# Patient Record
Sex: Female | Born: 1985 | Race: White | Hispanic: No | Marital: Married | Smoking: Never smoker
Health system: Southern US, Community
[De-identification: ages and names within clinical notes are randomized; demographics above are authoritative.]

## PROBLEM LIST (undated history)

## (undated) DIAGNOSIS — I10 Essential (primary) hypertension: Secondary | ICD-10-CM

## (undated) DIAGNOSIS — O163 Unspecified maternal hypertension, third trimester: Secondary | ICD-10-CM

## (undated) DIAGNOSIS — K219 Gastro-esophageal reflux disease without esophagitis: Secondary | ICD-10-CM

## (undated) DIAGNOSIS — Z9889 Other specified postprocedural states: Secondary | ICD-10-CM

## (undated) DIAGNOSIS — R112 Nausea with vomiting, unspecified: Secondary | ICD-10-CM

## (undated) DIAGNOSIS — K760 Fatty (change of) liver, not elsewhere classified: Secondary | ICD-10-CM

## (undated) DIAGNOSIS — O24419 Gestational diabetes mellitus in pregnancy, unspecified control: Secondary | ICD-10-CM

## (undated) HISTORY — PX: WISDOM TOOTH EXTRACTION: SHX21

## (undated) HISTORY — PX: KIDNEY STONE SURGERY: SHX686

## (undated) HISTORY — DX: Gestational diabetes mellitus in pregnancy, unspecified control: O24.419

---

## 2008-07-27 ENCOUNTER — Other Ambulatory Visit: Payer: Self-pay

## 2008-10-05 ENCOUNTER — Ambulatory Visit: Payer: Self-pay

## 2008-10-07 ENCOUNTER — Inpatient Hospital Stay: Payer: Self-pay

## 2010-04-13 ENCOUNTER — Observation Stay: Payer: Self-pay

## 2010-04-15 ENCOUNTER — Ambulatory Visit: Payer: Self-pay | Admitting: Obstetrics & Gynecology

## 2010-04-16 ENCOUNTER — Inpatient Hospital Stay: Payer: Self-pay | Admitting: Obstetrics & Gynecology

## 2015-05-23 ENCOUNTER — Ambulatory Visit (INDEPENDENT_AMBULATORY_CARE_PROVIDER_SITE_OTHER): Payer: BC Managed Care – PPO | Admitting: Family Medicine

## 2015-05-23 ENCOUNTER — Encounter: Payer: Self-pay | Admitting: Family Medicine

## 2015-05-23 VITALS — BP 108/72 | HR 67 | Temp 97.9°F | Ht 63.0 in | Wt 221.4 lb

## 2015-05-23 DIAGNOSIS — H6982 Other specified disorders of Eustachian tube, left ear: Secondary | ICD-10-CM | POA: Diagnosis not present

## 2015-05-23 DIAGNOSIS — J029 Acute pharyngitis, unspecified: Secondary | ICD-10-CM

## 2015-05-23 DIAGNOSIS — H6503 Acute serous otitis media, bilateral: Secondary | ICD-10-CM

## 2015-05-23 MED ORDER — AMOXICILLIN-POT CLAVULANATE 875-125 MG PO TABS: 1.0000 | ORAL_TABLET | Freq: Two times a day (BID) | ORAL | Status: DC

## 2015-05-23 NOTE — Progress Notes (Signed)
Name: Susan Higgins   MRN: [id number]    DOB: [birth year]   Date:05/23/2015       Progress Note  Subjective  Chief Complaint  Chief Complaint  Patient presents with  . Sore Throat    Patient states that she has an area that is swollen on her neck and hurts to swallow. Patient states that she also has a cough    Sore Throat  This is a new problem. The current episode started 1 to 4 weeks ago. The problem has been waxing and waning. The pain is worse on the left side. There has been no fever. The pain is at a severity of 2/10. The pain is mild. Associated symptoms include congestion, coughing, ear pain, neck pain, shortness of breath and swollen glands. Pertinent negatives include no abdominal pain, diarrhea, drooling, ear discharge, headaches, hoarse voice, plugged ear sensation, stridor, trouble swallowing or vomiting. Associated symptoms comments: Eustachian symptoms. She has tried nothing for the symptoms. The treatment provided no relief.    No problem-specific assessment & plan notes found for this encounter.   History reviewed. No pertinent past medical history.  Past Surgical History  Procedure Laterality Date  . Wisdom tooth extraction    . Cesarean section  4098,1191    x 2    History reviewed. No pertinent family history.  Social History   Social History  . Marital Status: Married    Spouse Name: N/A  . Number of Children: N/A  . Years of Education: N/A   Occupational History  . Not on file.   Social History Main Topics  . Smoking status: Never Smoker   . Smokeless tobacco: Not on file  . Alcohol Use: No  . Drug Use: No  . Sexual Activity: Not on file   Other Topics Concern  . Not on file   Social History Narrative  . No narrative on file    No Known Allergies   Review of Systems  Constitutional: Negative for fever, chills, weight loss and malaise/fatigue.  HENT: Positive for congestion and ear pain. Negative for drooling, ear discharge, hoarse  voice, sore throat and trouble swallowing.   Eyes: Negative for blurred vision.  Respiratory: Positive for cough and shortness of breath. Negative for sputum production, wheezing and stridor.   Cardiovascular: Negative for chest pain, palpitations and leg swelling.  Gastrointestinal: Negative for heartburn, nausea, vomiting, abdominal pain, diarrhea, constipation, blood in stool and melena.  Genitourinary: Negative for dysuria, urgency, frequency and hematuria.  Musculoskeletal: Positive for neck pain. Negative for myalgias, back pain and joint pain.  Skin: Negative for rash.  Neurological: Negative for dizziness, tingling, sensory change, focal weakness and headaches.  Endo/Heme/Allergies: Negative for environmental allergies and polydipsia. Does not bruise/bleed easily.  Psychiatric/Behavioral: Negative for depression and suicidal ideas. The patient is not nervous/anxious and does not have insomnia.      Objective  Filed Vitals:   05/23/15 0822  BP: 108/72  Pulse: 67  Temp: 97.9 F (36.6 C)  TempSrc: Oral  Height:  (1.6 m)  Weight: 221 lb 6.4 oz (100.426 kg)  SpO2: 98%    Physical Exam  Constitutional: She is well-developed, well-nourished, and in no distress. No distress.  HENT:  Head: Normocephalic and atraumatic.  Right Ear: External ear and ear canal normal. A middle ear effusion is present.  Left Ear: External ear and ear canal normal. A middle ear effusion is present.  Nose: Nose normal. No mucosal edema, rhinorrhea or nose lacerations.  Mouth/Throat: Posterior oropharyngeal erythema present. No posterior oropharyngeal edema.  Eyes: Conjunctivae and EOM are normal. Pupils are equal, round, and reactive to light. Right eye exhibits no discharge. Left eye exhibits no discharge.  Neck: Normal range of motion. Neck supple. No JVD present. No thyromegaly present.  Cardiovascular: Normal rate, regular rhythm, normal heart sounds and intact distal pulses.  Exam reveals no  gallop and no friction rub.   No murmur heard. Pulmonary/Chest: Effort normal and breath sounds normal.  Abdominal: Soft. Bowel sounds are normal. She exhibits no mass. There is no tenderness. There is no guarding.  Musculoskeletal: Normal range of motion. She exhibits no edema.  Lymphadenopathy:       Head (right side): No submental, no submandibular and no preauricular adenopathy present.       Head (left side): No submental, no submandibular and no preauricular adenopathy present.    She has no cervical adenopathy.       Right cervical: No superficial cervical, no deep cervical and no posterior cervical adenopathy present.      Left cervical: No superficial cervical, no deep cervical and no posterior cervical adenopathy present.  Neurological: She is alert.  Skin: Skin is warm and dry. She is not diaphoretic.  Psychiatric: Mood and affect normal.  Nursing note and vitals reviewed.     Assessment & Plan  Problem List Items Addressed This Visit    None    Visit Diagnoses    Pharyngitis    -  Primary    Relevant Medications    amoxicillin-clavulanate (AUGMENTIN) 875-125 MG tablet    Eustachian tube dysfunction, left        Bilateral acute serous otitis media, recurrence not specified        Relevant Medications    amoxicillin-clavulanate (AUGMENTIN) 875-125 MG tablet         Dr. Hayden Rasmusseneanna Dejuana Weist [facility]  05/23/2015

## 2015-11-18 DIAGNOSIS — Z3481 Encounter for supervision of other normal pregnancy, first trimester: Secondary | ICD-10-CM | POA: Insufficient documentation

## 2015-12-10 DIAGNOSIS — O34219 Maternal care for unspecified type scar from previous cesarean delivery: Secondary | ICD-10-CM | POA: Insufficient documentation

## 2015-12-10 DIAGNOSIS — Z6839 Body mass index (BMI) 39.0-39.9, adult: Secondary | ICD-10-CM | POA: Insufficient documentation

## 2015-12-10 DIAGNOSIS — Z8759 Personal history of other complications of pregnancy, childbirth and the puerperium: Secondary | ICD-10-CM | POA: Insufficient documentation

## 2015-12-10 LAB — OB RESULTS CONSOLE VARICELLA ZOSTER ANTIBODY, IGG: Varicella: IMMUNE

## 2015-12-10 LAB — OB RESULTS CONSOLE HEPATITIS B SURFACE ANTIGEN: Hepatitis B Surface Ag: NEGATIVE

## 2015-12-10 LAB — OB RESULTS CONSOLE RUBELLA ANTIBODY, IGM: Rubella: IMMUNE

## 2015-12-10 LAB — OB RESULTS CONSOLE RPR: RPR: NONREACTIVE

## 2016-02-12 DIAGNOSIS — N889 Noninflammatory disorder of cervix uteri, unspecified: Secondary | ICD-10-CM | POA: Insufficient documentation

## 2016-03-14 ENCOUNTER — Other Ambulatory Visit: Payer: Self-pay | Admitting: Obstetrics & Gynecology

## 2016-03-14 DIAGNOSIS — Z3689 Encounter for other specified antenatal screening: Secondary | ICD-10-CM

## 2016-04-07 ENCOUNTER — Ambulatory Visit: Payer: Self-pay

## 2016-04-16 ENCOUNTER — Ambulatory Visit: Payer: BC Managed Care – PPO | Admitting: Family Medicine

## 2016-04-24 LAB — OB RESULTS CONSOLE HIV ANTIBODY (ROUTINE TESTING): HIV: NONREACTIVE

## 2016-04-29 ENCOUNTER — Encounter: Payer: BC Managed Care – PPO | Attending: Obstetrics & Gynecology | Admitting: *Deleted

## 2016-04-29 ENCOUNTER — Encounter: Payer: Self-pay | Admitting: *Deleted

## 2016-04-29 VITALS — BP 112/82 | Ht 63.0 in | Wt 227.6 lb

## 2016-04-29 DIAGNOSIS — Z713 Dietary counseling and surveillance: Secondary | ICD-10-CM | POA: Insufficient documentation

## 2016-04-29 DIAGNOSIS — O24410 Gestational diabetes mellitus in pregnancy, diet controlled: Secondary | ICD-10-CM

## 2016-04-29 DIAGNOSIS — O24419 Gestational diabetes mellitus in pregnancy, unspecified control: Secondary | ICD-10-CM | POA: Insufficient documentation

## 2016-04-29 NOTE — Patient Instructions (Signed)
Read booklet on Gestational Diabetes Follow Gestational Meal Planning Guidelines Avoid sugar sweetened drinks (tea, juice) Limit fried foods and foods high in fat Complete a 3 Day Food Record and bring to next appointment Check blood sugars 4 x day - before breakfast and 2 hrs after every meal and record  Bring blood sugar log to all appointments Call MD for prescription for meter strips and lancets Strips   One Touch Verio  Lancets   One Touch Delica Purchase urine ketone strips if blood sugars not controlled and check urine ketones every am:  If + increase bedtime snack to 1 protein and 2 carbohydrate servings Walk 20-30 minutes at least 5 x week if permitted by MD

## 2016-04-29 NOTE — Progress Notes (Signed)
Diabetes Self-Management Education  Visit Type: First/Initial  Appt. Start Time: 1510 Appt. End Time: 1650  04/29/2016  [name], identified by name and date of birth, is a 31 y.o. female with a diagnosis of Diabetes: Gestational Diabetes.   ASSESSMENT  Blood pressure 112/82, height 5\' 3"  (1.6 m), weight 227 lb 9.6 oz (103.2 kg), last menstrual period 10/06/2015. Body mass index is 40.32 kg/m.      Diabetes Self-Management Education - 04/29/16 1811      Visit Information   Visit Type First/Initial     Initial Visit   Diabetes Type Gestational Diabetes   Are you currently following a meal plan? No   Are you taking your medications as prescribed? Yes   Date Diagnosed 4 days     Health Coping   How would you rate your overall health? Good     Psychosocial Assessment   Patient Belief/Attitude about Diabetes Other (comment)  "sad and worried about baby"   Self-care barriers None   Self-management support Doctor's office   Patient Concerns Nutrition/Meal planning;Other (comment)  "healthy baby"   Special Needs None   Preferred Learning Style Auditory;Visual   Learning Readiness Ready   How often do you need to have someone help you when you read instructions, pamphlets, or other written materials from your doctor or pharmacy? 1 - Never   What is the last grade level you completed in school? masters     Pre-Education Assessment   Patient understands the diabetes disease and treatment process. Needs Instruction   Patient understands incorporating nutritional management into lifestyle. Needs Instruction   Patient undertands incorporating physical activity into lifestyle. Needs Instruction   Patient understands using medications safely. Needs Instruction   Patient understands monitoring blood glucose, interpreting and using results Needs Instruction   Patient understands prevention, detection, and treatment of acute complications. Needs Instruction   Patient  understands prevention, detection, and treatment of chronic complications. Needs Instruction   Patient understands how to develop strategies to address psychosocial issues. Needs Instruction   Patient understands how to develop strategies to promote health/change behavior. Needs Instruction     Complications   How often do you check your blood sugar? 0 times/day (not testing)  Provided One Touch Verio Flex and instructed on use. BG upon return demonstration was 77 mg/dL at 8:754:40 pm - 3 1/2 hrs pp.    Have you had a dental exam in the past 12 months? Yes   Are you checking your feet? No     Dietary Intake   Breakfast 3 meals, 0-1 snacks   Lunch doesn't eat many vegetables   Beverage(s) water, juice, sugar sweetened tea     Exercise   Exercise Type ADL's     Patient Education   Previous Diabetes Education No   Disease state  Definition of diabetes, type 1 and 2, and the diagnosis of diabetes   Nutrition management  Role of diet in the treatment of diabetes and the relationship between the three main macronutrients and blood glucose level   Physical activity and exercise  Role of exercise on diabetes management, blood pressure control and cardiac health.   Monitoring Taught/evaluated SMBG meter.;Purpose and frequency of SMBG.;Taught/discussed recording of test results and interpretation of SMBG.;Ketone testing, when, how.   Chronic complications Relationship between chronic complications and blood glucose control   Psychosocial adjustment Identified and addressed patients feelings and concerns about diabetes;Role of stress on diabetes   Preconception care Pregnancy and GDM  Role  of pre-pregnancy blood glucose control on the development of the fetus;Reviewed with patient blood glucose goals with pregnancy;Role of family planning for patients with diabetes     Individualized Goals (developed by patient)   Reducing Risk Healthy baby     Outcomes   Expected Outcomes Demonstrated interest in  learning. Expect positive outcomes      Individualized Plan for Diabetes Self-Management Training:   Learning Objective:  Patient will have a greater understanding of diabetes self-management. Patient education plan is to attend individual and/or group sessions per assessed needs and concerns.   Plan:   Patient Instructions  Read booklet on Gestational Diabetes Follow Gestational Meal Planning Guidelines Avoid sugar sweetened drinks (tea, juice) Limit fried foods and foods high in fat Complete a 3 Day Food Record and bring to next appointment Check blood sugars 4 x day - before breakfast and 2 hrs after every meal and record  Bring blood sugar log to all appointments Call MD for prescription for meter strips and lancets Strips   One Touch Verio  Lancets   One Touch Delica Purchase urine ketone strips if blood sugars not controlled and check urine ketones every am:  If + increase bedtime snack to 1 protein and 2 carbohydrate servings Walk 20-30 minutes at least 5 x week if permitted by MD   Expected Outcomes:  Demonstrated interest in learning. Expect positive outcomes  Education material provided:  Gestational Booklet Gestational Meal Planning Guidelines Viewed Gestational Diabetes Video Meter = One Touch Verio Flex 3 Day Food Record Goals for a Healthy Pregnancy  If problems or questions, patient to contact team via:  Sharion Settler, RN, CCM, CDE [phone]  Future DSME appointment:  Tuesday May 06, 2016 with dietitian

## 2016-05-06 ENCOUNTER — Encounter: Payer: BC Managed Care – PPO | Admitting: Dietician

## 2016-05-06 ENCOUNTER — Encounter: Payer: Self-pay | Admitting: Dietician

## 2016-05-06 VITALS — BP 120/84 | Ht 63.0 in | Wt 224.3 lb

## 2016-05-06 DIAGNOSIS — Z713 Dietary counseling and surveillance: Secondary | ICD-10-CM | POA: Diagnosis not present

## 2016-05-06 DIAGNOSIS — S0291XA Unspecified fracture of skull, initial encounter for closed fracture: Secondary | ICD-10-CM | POA: Insufficient documentation

## 2016-05-06 DIAGNOSIS — O24410 Gestational diabetes mellitus in pregnancy, diet controlled: Secondary | ICD-10-CM

## 2016-05-06 NOTE — Progress Notes (Signed)
   Patient's BG record indicates BGs are all within goal ranges.   Patient's food diary indicates controlled carb intake and inclusion of protein sources. She does not eat any vegetables, has been trying to manage a few bites of broccoli or spinach, loves fruits. She has not been eating an evening snack due to lack of hunger. Has lost 3-5lbs in the past week due to diet changes-- significant decrease in sugary beverages and sweets as well as food portions.  Provided 1800kcal meal plan, and wrote individualized menus based on patient's food preferences. Advised frequent intake of fruits in small portions with protein sources. Instructed patient to begin eating a bedtime snack if fasting BGs increase. Advised her to increase protein portions if weight loss continues.   Instructed patient on food safety, including avoidance of Listeriosis, and limiting mercury from fish (does not eat fish).  Discussed importance of maintaining healthy lifestyle habits to reduce risk of Type 2 DM as well as Gestational DM with any future pregnancies.  Advised patient to use any remaining testing supplies to test some BGs after delivery, and to have BG tested ideally annually, as well as prior to attempting future pregnancies.

## 2016-05-06 NOTE — Patient Instructions (Signed)
   Continue with current eating pattern  If weight loss continues, increase portions of protein foods and/or add small amounts of healthy fat, such as salad dressing, soft margarine, or mayo.   If fasting blood sugars increase, experiment with adding a small evening snack that contains some carb and protein, especially on days you are walking for exercise.

## 2016-06-09 LAB — OB RESULTS CONSOLE GC/CHLAMYDIA
Chlamydia: NEGATIVE
GC PROBE AMP, GENITAL: NEGATIVE

## 2016-06-09 LAB — OB RESULTS CONSOLE GBS: STREP GROUP B AG: NEGATIVE

## 2016-06-19 ENCOUNTER — Encounter
Admission: RE | Admit: 2016-06-19 | Discharge: 2016-06-19 | Disposition: A | Discharge status: Home / Self Care | Payer: BC Managed Care – PPO | Source: Ambulatory Visit | Attending: Obstetrics & Gynecology | Admitting: Obstetrics & Gynecology

## 2016-06-19 HISTORY — DX: Other specified postprocedural states: Z98.890

## 2016-06-19 HISTORY — DX: Essential (primary) hypertension: I10

## 2016-06-19 HISTORY — DX: Nausea with vomiting, unspecified: R11.2

## 2016-06-19 LAB — CBC
HEMATOCRIT: 36 % (ref 35.0–47.0)
HEMOGLOBIN: 12.5 g/dL (ref 12.0–16.0)
MCH: 30.7 pg (ref 26.0–34.0)
MCHC: 34.7 g/dL (ref 32.0–36.0)
MCV: 88.3 fL (ref 80.0–100.0)
Platelets: 194 10*3/uL (ref 150–440)
RBC: 4.07 MIL/uL (ref 3.80–5.20)
RDW: 13 % (ref 11.5–14.5)
WBC: 9.9 10*3/uL (ref 3.6–11.0)

## 2016-06-19 LAB — COMPREHENSIVE METABOLIC PANEL
ALBUMIN: 2.8 g/dL — AB (ref 3.5–5.0)
ALK PHOS: 111 U/L (ref 38–126)
ALT: 16 U/L (ref 14–54)
AST: 26 U/L (ref 15–41)
Anion gap: 6 (ref 5–15)
BILIRUBIN TOTAL: 0.5 mg/dL (ref 0.3–1.2)
BUN: 9 mg/dL (ref 6–20)
CALCIUM: 9.1 mg/dL (ref 8.9–10.3)
CO2: 25 mmol/L (ref 22–32)
CREATININE: 1.03 mg/dL — AB (ref 0.44–1.00)
Chloride: 107 mmol/L (ref 101–111)
GFR calc non Af Amer: >60 mL/min (ref >60)
GLUCOSE: 111 mg/dL — AB (ref 65–99)
Potassium: 3 mmol/L — ABNORMAL LOW (ref 3.5–5.1)
SODIUM: 138 mmol/L (ref 135–145)
Total Protein: 6.3 g/dL — ABNORMAL LOW (ref 6.5–8.1)

## 2016-06-19 LAB — TYPE AND SCREEN
ABO/RH(D): A NEG
Antibody Screen: NEGATIVE
Extend sample reason: UNDETERMINED

## 2016-06-19 NOTE — Patient Instructions (Signed)
  Your procedure is scheduled on: June 20, 2016 (Friday) Report to EMERGENCY DEPARTMENT ARRIVAL TIME 5:30 AM  Remember: Instructions that are not followed completely may result in serious medical risk, up to and including death, or upon the discretion of your surgeon and anesthesiologist your surgery may need to be rescheduled.    _x___ 1. Do not eat food or drink liquids after midnight. No gum chewing or  hard candies                              __x__ 2. No Alcohol for 24 hours before or after surgery.   __x__3. No Smoking for 24 prior to surgery.   ____  4. Bring all medications with you on the day of surgery if instructed.    __x__ 5. Notify your doctor if there is any change in your medical condition     (cold, fever, infections).     Do not wear jewelry, make-up, hairpins, clips or nail polish.  Do not wear lotions, powders, or perfumes.   Do not shave 48 hours prior to surgery. Men may shave face and neck.  Do not bring valuables to the hospital.    [facility] is not responsible for any belongings or valuables.               Contacts, dentures or bridgework may not be worn into surgery.  Leave your suitcase in the car. After surgery it may be brought to your room.  For patients admitted to the hospital, discharge time is determined by your treatment team                        Patients discharged the day of surgery will not be allowed to drive home.  You will need someone to drive you home and stay with you the night of your procedure.    Please read over the following fact sheets that you were given:   [facility] Preparing for Surgery and or MRSA Information   _x___ Take anti-hypertensive (unless it includes a diuretic), cardiac, seizure, asthma,     anti-reflux and psychiatric medicines. These include:  1. APPLY SCOPOLAMINE PATCH AS INSTRUCTED BY DR WARD ON APRIL  12 AT BEDTIME  2.  3.  4.  5.  6.  ____Fleets enema or Magnesium Citrate as directed.   _x___ Use  CHG Soap or sage wipes as directed on instruction sheet   ____ Use inhalers on the day of surgery and bring to hospital day of surgery  ____ Stop Metformin and Janumet 2 days prior to surgery.    ____ Take 1/2 of usual insulin dose the night before surgery and none on the morning     surgery.   _x___ Follow recommendations from Cardiologist, Pulmonologist or PCP regarding          stopping Aspirin, Coumadin, Pllavix ,Eliquis, Effient, or Pradaxa, and Pletal.  X____Stop Anti-inflammatories such as Advil, Aleve, Ibuprofen, Motrin, Naproxen, Naprosyn, Goodies powders or aspirin products. OK to take Tylenol   _x___ Stop supplements until after surgery.  But may continue Vitamin D, Vitamin B,       and multivitamin.   ____ Bring C-Pap to the hospital.

## 2016-06-20 ENCOUNTER — Inpatient Hospital Stay: Payer: BC Managed Care – PPO | Admitting: Anesthesiology

## 2016-06-20 ENCOUNTER — Encounter
Admission: RE | Disposition: A | Discharge status: Home / Self Care | Payer: Self-pay | Source: Ambulatory Visit | Attending: Obstetrics & Gynecology

## 2016-06-20 ENCOUNTER — Inpatient Hospital Stay
Admission: RE | Admit: 2016-06-20 | Discharge: 2016-06-22 | DRG: 765 | Disposition: A | Discharge status: Home / Self Care | Payer: BC Managed Care – PPO | Source: Ambulatory Visit | Attending: Obstetrics & Gynecology | Admitting: Obstetrics & Gynecology

## 2016-06-20 DIAGNOSIS — Z302 Encounter for sterilization: Secondary | ICD-10-CM | POA: Diagnosis not present

## 2016-06-20 DIAGNOSIS — J40 Bronchitis, not specified as acute or chronic: Secondary | ICD-10-CM | POA: Diagnosis present

## 2016-06-20 DIAGNOSIS — O26893 Other specified pregnancy related conditions, third trimester: Secondary | ICD-10-CM | POA: Diagnosis present

## 2016-06-20 DIAGNOSIS — O1494 Unspecified pre-eclampsia, complicating childbirth: Principal | ICD-10-CM | POA: Diagnosis present

## 2016-06-20 DIAGNOSIS — O24410 Gestational diabetes mellitus in pregnancy, diet controlled: Secondary | ICD-10-CM | POA: Diagnosis not present

## 2016-06-20 DIAGNOSIS — Z6791 Unspecified blood type, Rh negative: Secondary | ICD-10-CM

## 2016-06-20 DIAGNOSIS — Z20828 Contact with and (suspected) exposure to other viral communicable diseases: Secondary | ICD-10-CM | POA: Diagnosis present

## 2016-06-20 DIAGNOSIS — O3483 Maternal care for other abnormalities of pelvic organs, third trimester: Secondary | ICD-10-CM | POA: Diagnosis present

## 2016-06-20 DIAGNOSIS — Z8249 Family history of ischemic heart disease and other diseases of the circulatory system: Secondary | ICD-10-CM | POA: Diagnosis not present

## 2016-06-20 DIAGNOSIS — O26899 Other specified pregnancy related conditions, unspecified trimester: Secondary | ICD-10-CM

## 2016-06-20 DIAGNOSIS — O24420 Gestational diabetes mellitus in childbirth, diet controlled: Secondary | ICD-10-CM | POA: Diagnosis present

## 2016-06-20 DIAGNOSIS — O99214 Obesity complicating childbirth: Secondary | ICD-10-CM | POA: Diagnosis present

## 2016-06-20 DIAGNOSIS — O9952 Diseases of the respiratory system complicating childbirth: Secondary | ICD-10-CM | POA: Diagnosis present

## 2016-06-20 DIAGNOSIS — Z6841 Body Mass Index (BMI) 40.0 and over, adult: Secondary | ICD-10-CM | POA: Diagnosis not present

## 2016-06-20 DIAGNOSIS — Z6839 Body mass index (BMI) 39.0-39.9, adult: Secondary | ICD-10-CM

## 2016-06-20 DIAGNOSIS — O9962 Diseases of the digestive system complicating childbirth: Secondary | ICD-10-CM | POA: Diagnosis present

## 2016-06-20 DIAGNOSIS — O34219 Maternal care for unspecified type scar from previous cesarean delivery: Secondary | ICD-10-CM | POA: Diagnosis present

## 2016-06-20 DIAGNOSIS — O34211 Maternal care for low transverse scar from previous cesarean delivery: Secondary | ICD-10-CM | POA: Diagnosis present

## 2016-06-20 DIAGNOSIS — K219 Gastro-esophageal reflux disease without esophagitis: Secondary | ICD-10-CM | POA: Diagnosis present

## 2016-06-20 DIAGNOSIS — Z3A37 37 weeks gestation of pregnancy: Secondary | ICD-10-CM

## 2016-06-20 DIAGNOSIS — Q505 Embryonic cyst of broad ligament: Secondary | ICD-10-CM

## 2016-06-20 DIAGNOSIS — D62 Acute posthemorrhagic anemia: Secondary | ICD-10-CM | POA: Diagnosis not present

## 2016-06-20 DIAGNOSIS — O9081 Anemia of the puerperium: Secondary | ICD-10-CM | POA: Diagnosis not present

## 2016-06-20 DIAGNOSIS — O1490 Unspecified pre-eclampsia, unspecified trimester: Secondary | ICD-10-CM | POA: Diagnosis present

## 2016-06-20 LAB — RPR: RPR Ser Ql: NONREACTIVE

## 2016-06-20 LAB — GLUCOSE, CAPILLARY
GLUCOSE-CAPILLARY: 77 mg/dL (ref 65–99)
GLUCOSE-CAPILLARY: 87 mg/dL (ref 65–99)

## 2016-06-20 LAB — ABO/RH: ABO/RH(D): A NEG

## 2016-06-20 SURGERY — Surgical Case
Anesthesia: Spinal | Site: Abdomen | Laterality: Bilateral | Wound class: Clean Contaminated

## 2016-06-20 MED ORDER — SODIUM CHLORIDE 0.9 % IJ SOLN
INTRAMUSCULAR | Status: AC
  Filled 2016-06-20: qty 50

## 2016-06-20 MED ORDER — ONDANSETRON HCL 4 MG/2ML IJ SOLN: 4.0000 mg | Freq: Three times a day (TID) | INTRAMUSCULAR | Status: DC | PRN

## 2016-06-20 MED ORDER — GUAIFENESIN-CODEINE 100-10 MG/5ML PO SOLN
5.0000 mL | ORAL | Status: DC | PRN
  Filled 2016-06-20: qty 5

## 2016-06-20 MED ORDER — NALBUPHINE HCL 10 MG/ML IJ SOLN
5.0000 mg | INTRAMUSCULAR | Status: DC | PRN
  Filled 2016-06-20: qty 1

## 2016-06-20 MED ORDER — BUPIVACAINE HCL 0.5 % IJ SOLN
INTRAMUSCULAR | Status: DC | PRN
  Administered 2016-06-20: 30 mL

## 2016-06-20 MED ORDER — DIPHENHYDRAMINE HCL 25 MG PO CAPS: 25.0000 mg | ORAL_CAPSULE | Freq: Four times a day (QID) | ORAL | Status: DC | PRN

## 2016-06-20 MED ORDER — ALBUTEROL SULFATE (2.5 MG/3ML) 0.083% IN NEBU: 2.5000 mg | INHALATION_SOLUTION | Freq: Four times a day (QID) | RESPIRATORY_TRACT | Status: DC | PRN

## 2016-06-20 MED ORDER — SODIUM CHLORIDE 0.9 % IV SOLN
INTRAVENOUS | Status: DC | PRN
  Administered 2016-06-20: 60 mL

## 2016-06-20 MED ORDER — MORPHINE SULFATE (PF) 0.5 MG/ML IJ SOLN
INTRAMUSCULAR | Status: DC | PRN
  Administered 2016-06-20: .2 mg via EPIDURAL

## 2016-06-20 MED ORDER — PHENYLEPHRINE HCL 10 MG/ML IJ SOLN
INTRAMUSCULAR | Status: DC | PRN
Start: 2016-06-20 — End: 2016-06-20
  Administered 2016-06-20: 100 ug via INTRAVENOUS

## 2016-06-20 MED ORDER — OXYTOCIN 40 UNITS IN LACTATED RINGERS INFUSION - SIMPLE MED
INTRAVENOUS | Status: DC | PRN
  Administered 2016-06-20: 400 mL via INTRAVENOUS
  Administered 2016-06-20: 1000 mL via INTRAVENOUS

## 2016-06-20 MED ORDER — AZITHROMYCIN 250 MG PO TABS
250.0000 mg | ORAL_TABLET | Freq: Every day | ORAL | Status: DC
  Administered 2016-06-20 – 2016-06-22 (×3): 250 mg via ORAL
  Filled 2016-06-20 (×3): qty 1

## 2016-06-20 MED ORDER — OXYCODONE HCL 5 MG PO TABS: 10.0000 mg | ORAL_TABLET | ORAL | Status: DC | PRN

## 2016-06-20 MED ORDER — ACETAMINOPHEN 325 MG PO TABS: 650.0000 mg | ORAL_TABLET | ORAL | Status: DC | PRN

## 2016-06-20 MED ORDER — EPHEDRINE SULFATE-NACL 50-0.9 MG/10ML-% IV SOSY
PREFILLED_SYRINGE | INTRAVENOUS | Status: DC | PRN
  Administered 2016-06-20 (×2): 10 mg via INTRAVENOUS
  Administered 2016-06-20: 15 mg via INTRAVENOUS

## 2016-06-20 MED ORDER — WITCH HAZEL-GLYCERIN EX PADS: 1.0000 "application " | MEDICATED_PAD | CUTANEOUS | Status: DC | PRN

## 2016-06-20 MED ORDER — MEPERIDINE HCL 25 MG/ML IJ SOLN: 6.2500 mg | INTRAMUSCULAR | Status: DC | PRN

## 2016-06-20 MED ORDER — EPHEDRINE SULFATE 50 MG/ML IJ SOLN
INTRAMUSCULAR | Status: AC
  Filled 2016-06-20: qty 1

## 2016-06-20 MED ORDER — FENTANYL CITRATE (PF) 100 MCG/2ML IJ SOLN: 25.0000 ug | INTRAMUSCULAR | Status: DC | PRN

## 2016-06-20 MED ORDER — ONDANSETRON HCL 4 MG/2ML IJ SOLN
INTRAMUSCULAR | Status: DC | PRN
  Administered 2016-06-20: 4 mg via INTRAVENOUS

## 2016-06-20 MED ORDER — LACTATED RINGERS IV SOLN
INTRAVENOUS | Status: DC
  Administered 2016-06-20: 125 mL/h via INTRAVENOUS

## 2016-06-20 MED ORDER — MORPHINE SULFATE (PF) 0.5 MG/ML IJ SOLN
INTRAMUSCULAR | Status: AC
  Filled 2016-06-20: qty 10

## 2016-06-20 MED ORDER — PRENATAL MULTIVITAMIN CH
1.0000 | ORAL_TABLET | Freq: Every day | ORAL | Status: DC
  Administered 2016-06-21: 1 via ORAL
  Filled 2016-06-20: qty 1

## 2016-06-20 MED ORDER — COCONUT OIL OIL
1.0000 "application " | TOPICAL_OIL | Status: DC | PRN
  Administered 2016-06-21: 1 via TOPICAL
  Filled 2016-06-20: qty 120

## 2016-06-20 MED ORDER — GLYCOPYRROLATE 0.2 MG/ML IJ SOLN
INTRAMUSCULAR | Status: AC
  Filled 2016-06-20: qty 2

## 2016-06-20 MED ORDER — DEXTROSE 5 % IV SOLN
2.0000 g | INTRAVENOUS | Status: AC
  Filled 2016-06-20: qty 2000

## 2016-06-20 MED ORDER — NALOXONE HCL 2 MG/2ML IJ SOSY
1.0000 ug/kg/h | PREFILLED_SYRINGE | INTRAVENOUS | Status: DC | PRN
  Filled 2016-06-20: qty 2

## 2016-06-20 MED ORDER — SODIUM CHLORIDE FLUSH 0.9 % IV SOLN
INTRAVENOUS | Status: AC
  Filled 2016-06-20: qty 10

## 2016-06-20 MED ORDER — SODIUM CHLORIDE 0.9% FLUSH: 3.0000 mL | INTRAVENOUS | Status: DC | PRN

## 2016-06-20 MED ORDER — IBUPROFEN 600 MG PO TABS
600.0000 mg | ORAL_TABLET | Freq: Four times a day (QID) | ORAL | Status: DC
  Administered 2016-06-21 – 2016-06-22 (×4): 600 mg via ORAL
  Filled 2016-06-20 (×4): qty 1

## 2016-06-20 MED ORDER — SODIUM CHLORIDE 0.9% FLUSH: 3.0000 mL | Freq: Two times a day (BID) | INTRAVENOUS | Status: DC

## 2016-06-20 MED ORDER — ACETAMINOPHEN 650 MG RE SUPP
650.0000 mg | Freq: Once | RECTAL | Status: AC
  Administered 2016-06-20: 650 mg via RECTAL
  Filled 2016-06-20 (×2): qty 1

## 2016-06-20 MED ORDER — OXYTOCIN 10 UNIT/ML IJ SOLN
INTRAMUSCULAR | Status: AC
  Filled 2016-06-20: qty 4

## 2016-06-20 MED ORDER — PROPOFOL 10 MG/ML IV BOLUS
INTRAVENOUS | Status: AC
  Filled 2016-06-20: qty 20

## 2016-06-20 MED ORDER — ONDANSETRON HCL 4 MG/2ML IJ SOLN
4.0000 mg | Freq: Once | INTRAMUSCULAR | Status: DC | PRN
Start: 2016-06-20 — End: 2016-06-20

## 2016-06-20 MED ORDER — GLYCOPYRROLATE 0.2 MG/ML IJ SOLN
INTRAMUSCULAR | Status: DC | PRN
  Administered 2016-06-20: 0.4 mg via INTRAVENOUS

## 2016-06-20 MED ORDER — PHENYLEPHRINE HCL 10 MG/ML IJ SOLN
INTRAMUSCULAR | Status: AC
  Filled 2016-06-20: qty 1

## 2016-06-20 MED ORDER — BENZONATATE 100 MG PO CAPS
200.0000 mg | ORAL_CAPSULE | Freq: Three times a day (TID) | ORAL | Status: DC
  Administered 2016-06-20 – 2016-06-22 (×7): 200 mg via ORAL
  Filled 2016-06-20 (×10): qty 2

## 2016-06-20 MED ORDER — BUPIVACAINE LIPOSOME 1.3 % IJ SUSP
20.0000 mL | Freq: Once | INTRAMUSCULAR | Status: DC
Start: 2016-06-20 — End: 2016-06-22
  Filled 2016-06-20: qty 20

## 2016-06-20 MED ORDER — FENTANYL CITRATE (PF) 100 MCG/2ML IJ SOLN
INTRAMUSCULAR | Status: AC
Start: 2016-06-20 — End: 2016-06-20
  Filled 2016-06-20: qty 2

## 2016-06-20 MED ORDER — BUPIVACAINE HCL (PF) 0.5 % IJ SOLN
30.0000 mL | Freq: Once | INTRAMUSCULAR | Status: DC
  Filled 2016-06-20: qty 30

## 2016-06-20 MED ORDER — SENNOSIDES-DOCUSATE SODIUM 8.6-50 MG PO TABS
2.0000 | ORAL_TABLET | ORAL | Status: DC
  Administered 2016-06-21: 2 via ORAL
  Filled 2016-06-20: qty 2

## 2016-06-20 MED ORDER — DIBUCAINE 1 % RE OINT: 1.0000 "application " | TOPICAL_OINTMENT | RECTAL | Status: DC | PRN

## 2016-06-20 MED ORDER — NALBUPHINE HCL 10 MG/ML IJ SOLN: 5.0000 mg | Freq: Once | INTRAMUSCULAR | Status: DC | PRN

## 2016-06-20 MED ORDER — KETOROLAC TROMETHAMINE 30 MG/ML IJ SOLN: 30.0000 mg | Freq: Four times a day (QID) | INTRAMUSCULAR | Status: AC | PRN

## 2016-06-20 MED ORDER — CEFAZOLIN SODIUM-DEXTROSE 2-4 GM/100ML-% IV SOLN
2.0000 g | INTRAVENOUS | Status: DC
  Administered 2016-06-20: 2 g via INTRAVENOUS
  Filled 2016-06-20: qty 100

## 2016-06-20 MED ORDER — ONDANSETRON HCL 4 MG/2ML IJ SOLN
INTRAMUSCULAR | Status: AC
  Filled 2016-06-20: qty 2

## 2016-06-20 MED ORDER — LACTATED RINGERS IV SOLN
INTRAVENOUS | Status: DC
  Administered 2016-06-20 – 2016-06-21 (×2): via INTRAVENOUS

## 2016-06-20 MED ORDER — OXYCODONE HCL 5 MG PO TABS: 5.0000 mg | ORAL_TABLET | Freq: Four times a day (QID) | ORAL | Status: DC | PRN

## 2016-06-20 MED ORDER — BUPIVACAINE IN DEXTROSE 0.75-8.25 % IT SOLN
INTRATHECAL | Status: DC | PRN
  Administered 2016-06-20: 1.6 mL via INTRATHECAL

## 2016-06-20 MED ORDER — SODIUM CHLORIDE 0.9 % IV SOLN: 250.0000 mL | INTRAVENOUS | Status: DC

## 2016-06-20 MED ORDER — OXYCODONE HCL 5 MG PO TABS: 5.0000 mg | ORAL_TABLET | ORAL | Status: DC | PRN

## 2016-06-20 MED ORDER — NALOXONE HCL 0.4 MG/ML IJ SOLN
0.4000 mg | INTRAMUSCULAR | Status: DC | PRN
Start: 2016-06-20 — End: 2016-06-22

## 2016-06-20 MED ORDER — FENTANYL CITRATE (PF) 100 MCG/2ML IJ SOLN
INTRAMUSCULAR | Status: DC | PRN
  Administered 2016-06-20: 15 ug via INTRAVENOUS

## 2016-06-20 MED ORDER — MENTHOL 3 MG MT LOZG
1.0000 | LOZENGE | OROMUCOSAL | Status: DC | PRN
  Filled 2016-06-20: qty 9

## 2016-06-20 MED ORDER — NALBUPHINE HCL 10 MG/ML IJ SOLN: 5.0000 mg | INTRAMUSCULAR | Status: DC | PRN

## 2016-06-20 MED ORDER — OXYTOCIN 40 UNITS IN LACTATED RINGERS INFUSION - SIMPLE MED: 2.5000 [IU]/h | INTRAVENOUS | Status: AC

## 2016-06-20 MED ORDER — SOD CITRATE-CITRIC ACID 500-334 MG/5ML PO SOLN
30.0000 mL | ORAL | Status: AC
  Administered 2016-06-20: 30 mL via ORAL
  Filled 2016-06-20: qty 15

## 2016-06-20 MED ORDER — LACTATED RINGERS IV SOLN: Freq: Once | INTRAVENOUS | Status: DC

## 2016-06-20 MED ORDER — KETOROLAC TROMETHAMINE 30 MG/ML IJ SOLN
30.0000 mg | Freq: Four times a day (QID) | INTRAMUSCULAR | Status: AC | PRN
  Administered 2016-06-20 – 2016-06-21 (×3): 30 mg via INTRAVENOUS
  Filled 2016-06-20 (×3): qty 1

## 2016-06-20 MED ORDER — SIMETHICONE 80 MG PO CHEW: 160.0000 mg | CHEWABLE_TABLET | Freq: Four times a day (QID) | ORAL | Status: DC | PRN

## 2016-06-20 SURGICAL SUPPLY — 36 items
BARRIER ADHS 3X4 INTERCEED (GAUZE/BANDAGES/DRESSINGS) ×3 IMPLANT
CANISTER SUCT 3000ML (MISCELLANEOUS) ×3 IMPLANT
CATH KIT ON-Q SILVERSOAK 5IN (CATHETERS) IMPLANT
CLOSURE WOUND 1/2 X4 (GAUZE/BANDAGES/DRESSINGS) ×1
DERMABOND ADVANCED (GAUZE/BANDAGES/DRESSINGS) ×2
DERMABOND ADVANCED .7 DNX12 (GAUZE/BANDAGES/DRESSINGS) ×1 IMPLANT
DRSG TELFA 3X8 NADH (GAUZE/BANDAGES/DRESSINGS) ×3 IMPLANT
ELECT CAUTERY BLADE 6.4 (BLADE) ×3 IMPLANT
ELECT REM PT RETURN 9FT ADLT (ELECTROSURGICAL) ×3
ELECTRODE REM PT RTRN 9FT ADLT (ELECTROSURGICAL) ×1 IMPLANT
GAUZE SPONGE 4X4 12PLY STRL (GAUZE/BANDAGES/DRESSINGS) ×3 IMPLANT
GLOVE PI ORTHOPRO 6.5 (GLOVE) ×2
GLOVE PI ORTHOPRO STRL 6.5 (GLOVE) ×1 IMPLANT
GLOVE SURG SYN 6.5 ES PF (GLOVE) ×3 IMPLANT
GOWN STRL REUS W/ TWL LRG LVL3 (GOWN DISPOSABLE) ×3 IMPLANT
GOWN STRL REUS W/TWL LRG LVL3 (GOWN DISPOSABLE) ×6
KIT PREVENA INCISION MGT20CM45 (CANNISTER) ×3 IMPLANT
NS IRRIG 1000ML POUR BTL (IV SOLUTION) ×3 IMPLANT
PACK C SECTION AR (MISCELLANEOUS) ×3 IMPLANT
PAD OB MATERNITY 4.3X12.25 (PERSONAL CARE ITEMS) ×3 IMPLANT
PAD PREP 24X41 OB/GYN DISP (PERSONAL CARE ITEMS) ×3 IMPLANT
RTRCTR C-SECT PINK 25CM LRG (MISCELLANEOUS) ×3 IMPLANT
SPONGE LAP 18X18 5 PK (GAUZE/BANDAGES/DRESSINGS) ×3 IMPLANT
STRAP SAFETY BODY (MISCELLANEOUS) ×3 IMPLANT
STRIP CLOSURE SKIN 1/2X4 (GAUZE/BANDAGES/DRESSINGS) ×2 IMPLANT
SUT MNCRL 4-0 (SUTURE) ×2
SUT MNCRL 4-0 27XMFL (SUTURE) ×1
SUT PDS AB 1 TP1 96 (SUTURE) ×3 IMPLANT
SUT VIC AB 0 CT1 36 (SUTURE) ×6 IMPLANT
SUT VIC AB 2-0 CT1 27 (SUTURE) ×6
SUT VIC AB 2-0 CT1 TAPERPNT 27 (SUTURE) ×3 IMPLANT
SUT VIC AB 3-0 SH 27 (SUTURE) ×2
SUT VIC AB 3-0 SH 27X BRD (SUTURE) ×1 IMPLANT
SUTURE MNCRL 4-0 27XMF (SUTURE) ×1 IMPLANT
SWABSTK COMLB BENZOIN TINCTURE (MISCELLANEOUS) ×3 IMPLANT
SYR 30ML LL (SYRINGE) ×3 IMPLANT

## 2016-06-20 NOTE — Discharge Summary (Signed)
Obstetrical Discharge Summary  Patient Name: Susan Higgins DOB: 1985/06/30 MRN: [id number]  Date of Admission: 06/20/2016 Date of Delivery: 06/20/16 Delivered by: Ranae Plumber Date of Discharge: 06/22/16 Primary OB: [facility] [id number] last menstrual period was 10/06/2015. EDC Estimated Date of Delivery: 07/09/16 Gestational Age at Delivery: [redacted]w[redacted]d   Antepartum complications:   1. Rh negative, FOB also neg, no Rhogam given 2. Preeclampsia without severe features 3. GDMA1 4. History of prior CS x2 with wound infection 5. Desires BTL 6. Current bronchitis 7. Obesity 8. Parvovirus exposure this pregnancy, not immune.  Admitting Diagnosis:  Scheduled Cesarean Delivery for Preeclampsia  Secondary Diagnosis: Patient Active Problem List   Diagnosis Date Noted  . Labor and delivery, indication for care 06/20/2016  . Preeclampsia 06/20/2016  . Rh negative state in antepartum period 06/20/2016  . GDM (gestational diabetes mellitus), class A1 06/20/2016  . Postpartum care following cesarean delivery 06/20/2016  . Skull fracture (HCC) 05/06/2016  . BMI 39.0-39.9,adult 12/10/2015  . Previous cesarean delivery affecting pregnancy, antepartum 12/10/2015    Augmentation: none Complications: None Intrapartum complications/course: patient presented on day of scheduled surgery, uncomplicated cesarean with BTL Date of Delivery: 06/20/16 Delivered By: Leeroy Bock Ward Delivery Type: repeat cesarean section, low transverse incision with double layer closure, and BTL Anesthesia: spinal Placenta: expressed Laceration: n/a Episiotomy: none Newborn Data: Live born female  Birth Weight: 6 lb 13.7 oz (3110 g) APGAR: 8, 9   Postpartum Procedures:none  Post partum course:NUncomplicated PP course  Patient had an uncomplicated postpartum course.  By time of discharge on POD#2, her pain was controlled on oral pain medications; she had appropriate lochia and was ambulating, voiding  without difficulty, tolerating regular diet and passing flatus.   She was deemed stable for discharge to home.    Discharge Physical Exam: 06/22/16 BP 126/78 (BP Location: Left Arm)   Pulse 82   Temp 98 F (36.7 C) (Oral)   Resp 18   Ht  (1.6 m)   Wt 105.2 kg (232 lb)   LMP 10/06/2015   SpO2 100%   BMI 41.10 kg/m   General: NAD CV: RRR Pulm: CTABL, nl effort ABD: s/nd/nt, fundus firm and below the umbilicus Lochia: moderate Incision: covered in activated wound vacuum DVT Evaluation: LE non-ttp, no evidence of DVT on exam.  Hemoglobin  Date Value Ref Range Status  06/21/2016 10.7 (L) 12.0 - 16.0 g/dL Final   HCT  Date Value Ref Range Status  06/21/2016 30.0 (L) 35.0 - 47.0 % Final     Disposition: stable, discharge to home. Baby Feeding: breastmilk Baby Disposition: home with mom  Rh Immune globulin given: n/a Rubella vaccine given: n/a Tdap vaccine given in AP or PP setting: AP Flu vaccine given in AP or PP setting: AP  Contraception: BTL  Prenatal Labs:  Blood type/Rh A neg (FOB also neg)  Antibody screen neg  Rubella Immune  Varicella Immune  RPR NR  HBsAg Neg  HIV NR  GC neg  Chlamydia neg  Genetic screening negative  1 hour GTT 160  3 hour GTT 95 178 168 143  GBS neg     Plan:  Susan Higgins was discharged to home in good condition. Follow-up appointment at [facility] 1 week for removal of wound vac.   Discharge Medications: Allergies as of 06/22/2016   No Known Allergies     Medication List    STOP taking these medications   azithromycin 250 MG tablet Commonly known as:  ZITHROMAX   calcium carbonate 750 MG chewable tablet Commonly known as:  TUMS EX   scopolamine 1 MG/3DAYS Commonly known as:  TRANSDERM-SCOP     TAKE these medications   ibuprofen 600 MG tablet Commonly known as:  ADVIL,MOTRIN Take 1 tablet (600 mg total) by mouth every 6 (six) hours.   loratadine-pseudoephedrine 10-240 MG 24 hr  tablet Commonly known as:  CLARITIN-D 24-hour Take 1 tablet by mouth daily as needed for allergies.   oxyCODONE 5 MG immediate release tablet Commonly known as:  Oxy IR/ROXICODONE Take 1 tablet (5 mg total) by mouth every 4 (four) hours as needed (pain scale 4-7).   PRENATAL PO Take 1 tablet by mouth daily.         Signed: Ihor Austin Keatyn Jawad MD

## 2016-06-20 NOTE — Op Note (Signed)
Cesarean Section Procedure Note  06/20/2016  Patient:  Susan Higgins  31 y.o. female at [redacted]w[redacted]d.  Patient's last menstrual period was 10/06/2015.  Preoperative diagnosis:   1. Preeclampsia at 37 weeks 2. Prior C/S  x2 3. History of wound infection after C/S 4. Desired permanent sterilization 5. GDMA1 6. Obesity  Postoperative diagnosis:   Same as above, live born female  PROCEDURE:  Procedure(s): CESAREAN SECTION WITH BILATERAL TUBAL LIGATION (Bilateral)   Surgeon(s) and Role:    * Chelsea C Ward, MD - Primary Anesthesia:  spinal I/O: Total I/O In: -  Out: 1150 [Urine:550; Blood:600] Specimens:  Cord Blood, placenta, portion of right tube, portion of left tube Complications: None Apparent Disposition:  VS stable to PACU  Findings: normal uterus, tubes and ovaries bilaterally Live born female  Birth Weight: 6 lb 13.7 oz (3110 g) APGAR: 8, 9   Indication for procedure: 31 y.o. female at [redacted]w[redacted]d with preeclampsia diagnosed in 3rd trimester   Procedure Details   The risks, benefits, complications, treatment options, and expected outcomes were discussed with the patient. Informed consent was obtained. The patient was taken to Operating Room, identified as ADARIA HOLE and the procedure verified as a cesarean delivery and confirmed desired permanent sterilization.   After administration of anesthesia, the patient was prepped and draped in the usual sterile manner, including a vaginal prep. A surgical time out was performed, with the pediatric team present. After confirming adequate anesthesia, a Pfannenstiel incision was made and carried down through the subcutaneous tissue to the fascia. Fascial incision was made and extended transversely. The fascia was separated from the underlying rectus tissue superiorly and inferiorly. The peritoneum was identified and entered. Peritoneal incision was extended longitudinally.  A low transverse uterine incision was made. Delivered from cephalic  presentation was a live born female . Delayed cord clamping was performed for 60 seconds. The umbilical cord was doubly clamped and cut, and the baby was handed off to the awaitng pediatrician.  Cord blood was obtained for evaluation. The placenta was removed intact and appeared normal. The uterus was delivered from the abdominal cavity and cleared of clots, membranes, and debris. The uterus, tubes and ovaries appeared normal. The uterine incision was closed with running locking sutures of 0 Vicryl, and then a second, imbricating stitch was placed. Hemostasis was observed.   The attention was turned to the bilateral fallopian tubes.  The tubes were traced to their fimbriated ends, and grasped at the middle of the isthmus.  The mesosalpinx was opened, and suture ligation was placed at the proximal and distal end of the tube.  The portion of tube between the suture was divided and handed to nursing as portion of left tube and portion of right tube. These sites were hemostatic. The left tube had a cyst of Morgagni that was adherent to the serosa of the uterus; this was removed with cautery and a figure-of-eight stitch was placed for hemostasis, which was observed.    The abdominal cavity was evacuated of extraneous fluid. The uterus was returned to the abdominal cavity and again the incision was inspected for hemostasis, which was confirmed.  The paracolic gutters were cleaned.  Intercede was placed over the uterus. The fascia was then reapproximated with running suture of vicryl. 60cc of Long- and short-acting bupivicaine was injected circumferentially into the fascia.  After a change of gloves, the subcutaneous tissue was irrigated and reapproximated with 3-0 vicryl. The skin was closed with 4-0 Monocryl and 40cc of  long- and short-acting bupivacaine injected into the skin and subcutaneous tissues.  A wound-vacuum apparatus was placed over top the incision and employed; it was adequately suctioning prior to  leaving the OR.   Instrument, sponge, and needle counts were correct prior the abdominal closure and at the conclusion of the case.   I was present and performed this procedure in its entirety.  ----- Ranae Plumber, MD Attending Obstetrician and Gynecologist [facility], Department of OB/GYN [facility]

## 2016-06-20 NOTE — Anesthesia Post-op Follow-up Note (Cosign Needed)
Anesthesia QCDR form completed.        

## 2016-06-20 NOTE — Anesthesia Procedure Notes (Signed)
Spinal  Patient location during procedure: OR Start time: 06/20/2016 7:59 AM End time: 06/20/2016 8:04 AM Staffing Anesthesiologist: Martha Clan Resident/CRNA: Jonna Clark Performed: resident/CRNA  Preanesthetic Checklist Completed: patient identified, site marked, surgical consent, pre-op evaluation, timeout performed, IV checked, risks and benefits discussed and monitors and equipment checked Spinal Block Patient position: sitting Prep: ChloraPrep Patient monitoring: heart rate, continuous pulse ox, blood pressure and cardiac monitor Approach: midline Location: L3-4 Injection technique: single-shot Needle Needle type: Whitacre and Introducer  Needle gauge: 24 G Needle length: 9 cm Assessment Sensory level: T10 Additional Notes Negative paresthesia. Negative blood return. Positive free-flowing CSF. Expiration date of kit checked and confirmed. Patient tolerated procedure well, without complications.

## 2016-06-20 NOTE — Anesthesia Preprocedure Evaluation (Signed)
Anesthesia Evaluation  Patient identified by MRN, date of birth, ID band Patient awake    Reviewed: Allergy & Precautions, H&P , NPO status , Patient's Chart, lab work & pertinent test results, reviewed documented beta blocker date and time   History of Anesthesia Complications (+) PONV and history of anesthetic complications  Airway Mallampati: III  TM Distance: >3 FB Neck ROM: full    Dental  (+) Teeth Intact   Pulmonary neg pulmonary ROS,           Cardiovascular Exercise Tolerance: Good hypertension, (-) angina(-) CAD, (-) Past MI, (-) Cardiac Stents and (-) CABG (-) dysrhythmias (-) Valvular Problems/Murmurs     Neuro/Psych negative neurological ROS  negative psych ROS   GI/Hepatic Neg liver ROS, GERD (during pregnancy)  ,  Endo/Other  diabetes, Well Controlled, GestationalMorbid obesity  Renal/GU negative Renal ROS  negative genitourinary   Musculoskeletal   Abdominal   Peds  Hematology negative hematology ROS (+)   Anesthesia Other Findings Past Medical History: No date: Gestational diabetes No date: Hypertension No date: PONV (postoperative nausea and vomiting)   Reproductive/Obstetrics (+) Pregnancy                             Anesthesia Physical Anesthesia Plan  ASA: III  Anesthesia Plan: Spinal   Post-op Pain Management:    Induction:   Airway Management Planned:   Additional Equipment:   Intra-op Plan:   Post-operative Plan:   Informed Consent: I have reviewed the patients History and Physical, chart, labs and discussed the procedure including the risks, benefits and alternatives for the proposed anesthesia with the patient or authorized representative who has indicated his/her understanding and acceptance.   Dental Advisory Given  Plan Discussed with: Anesthesiologist, CRNA and Surgeon  Anesthesia Plan Comments:         Anesthesia Quick  Evaluation

## 2016-06-20 NOTE — Plan of Care (Signed)
Tessalon  PO given as ordered. Ellison Carwin RNC

## 2016-06-20 NOTE — Transfer of Care (Signed)
Immediate Anesthesia Transfer of Care Note  Patient: Susan Higgins  Procedure(s) Performed: Procedure(s): CESAREAN SECTION WITH BILATERAL TUBAL LIGATION (Bilateral)  Patient Location: PACU and Mother/Baby  Anesthesia Type:Spinal  Level of Consciousness: awake, alert  and oriented  Airway & Oxygen Therapy: Patient Spontanous Breathing  Post-op Assessment: Report given to RN and Post -op Vital signs reviewed and stable  Post vital signs: Reviewed and stable  Last Vitals:  Vitals:   06/20/16 0709 06/20/16 1009  BP:  (!) 136/93  Pulse:  63  Resp:  14  Temp: 36.8 C     Last Pain:  Vitals:   06/20/16 0709  TempSrc: Oral  PainSc: 0-No pain         Complications: No apparent anesthesia complications

## 2016-06-20 NOTE — H&P (Addendum)
OB [facility] C[facility] & Physical   History of Present Illness:  Chief Complaint:   HPI:  Enyah K Gaskins is a 31 y.o. G51P2002 female at [redacted]w[redacted]d dated by LMP 09/27/15.  She presents to L&D for scheduled CS at 37 weeks due to preeclampsia. Denies: HA, visual changes, SOB, or RUQ/epigastric pain  +FM, no CTX, no LOF, no VB  Pregnancy Issues: 1. Rh negative, FOB also neg, no Rhogam given 2. Preeclampsia without severe features 3. GDMA1 4. History of prior CS x2 with wound infection 5. Desires BTL 6. Current bronchitis 7. Obesity 8. Parvovirus exposure this pregnancy, not immune.  Maternal Medical History:   Past Medical History:  Diagnosis Date  . Gestational diabetes   . Hypertension   . PONV (postoperative nausea and vomiting)     Past Surgical History:  Procedure Laterality Date  . CESAREAN SECTION  1610,9604   x 2  . WISDOM TOOTH EXTRACTION      No Known Allergies  Prior to Admission medications   Medication Sig Start Date End Date Taking? Authorizing Provider  azithromycin (ZITHROMAX) 250 MG tablet Take 250 mg by mouth daily.   Yes Historical Provider, MD  calcium carbonate (TUMS EX) 750 MG chewable tablet Chew 2 tablets by mouth daily as needed for heartburn.    Yes Historical Provider, MD  loratadine-pseudoephedrine (CLARITIN-D 24-HOUR) 10-240 MG 24 hr tablet Take 1 tablet by mouth daily as needed for allergies.   Yes Historical Provider, MD  Prenatal Vit-Fe Fumarate-FA (PRENATAL PO) Take 1 tablet by mouth daily.   Yes Historical Provider, MD  scopolamine (TRANSDERM-SCOP) 1 MG/3DAYS Place 1 patch onto the skin every 3 (three) days. Apply on June 19, 2016 at bedtime before surgery   Yes Historical Provider, MD     Prenatal care site: [facility]   Social History: She  reports that she has never smoked. She has never used smokeless tobacco. She reports that she does not drink alcohol or use drugs.  Family History: family history includes Hypertension in  her father.   Review of Systems: A full review of systems was performed and negative except as noted in the HPI.     Physical Exam:  Vital Signs: BP 123/89   Pulse 67   Temp 98.2 F (36.8 C) (Oral)   Ht  (1.6 m)   Wt 105.2 kg (232 lb)   LMP 10/06/2015   BMI 41.10 kg/m  General: no acute distress.  HEENT: normocephalic, atraumatic Heart: regular rate & rhythm.  No murmurs/rubs/gallops Lungs: clear to auscultation bilaterally, normal respiratory effort Abdomen: soft, gravid, non-tender;  EFW: 7lbs Pelvic:   External: Normal external female genitalia  Cervix: deferred   Extremities: non-tender, symmetric, 1+edema bilaterally.  DTRs: 2+  Neurologic: Alert & oriented x 3.    Results for orders placed or performed during the hospital encounter of 06/20/16 (from the past 24 hour(s))  Glucose, capillary     Status: None   Collection Time: 06/20/16  6:10 AM  Result Value Ref Range   Glucose-Capillary 77 65 - 99 mg/dL  ABO/Rh     Status: None   Collection Time: 06/20/16  6:15 AM  Result Value Ref Range   ABO/RH(D) A NEG     Pertinent Results:  Prenatal Labs: Blood type/Rh A neg (FOB also neg)  Antibody screen neg  Rubella Immune  Varicella Immune  RPR NR  HBsAg Neg  HIV NR  GC neg  Chlamydia neg  Genetic screening negative  1 hour GTT 160  3 hour GTT 95 178 168 143  GBS neg   P/C ratio: 313  FHT: 140 mod + accels no decels TOCO: occasional SVE:  deferred   Last [place]: 4/11, cephalic  Assessment:  Katherin Ramey Guettler is a 31 y.o. G4P2002 female at [redacted]w[redacted]d with scheduled CS with BTL for preeclampsia without severe features at term.   Plan:  1. Admit to Labor & Delivery 2. CBC, T&S, NPO, IVF 3. GBS  neg 4. Consents signed. 5. Continuous efm/toco until OR 6. GDM: no meds, stable, BS this AM 77 7. GHTN: stable, 130-40s/80-90s no meds 8. Pulm; bronchitis, on azithromax, continue PO 9. To OR for CS/BTL when team ready, wound vac planned  ----- Ranae Plumber,  MD Attending Obstetrician and Gynecologist [facility], Department of OB/GYN [facility]

## 2016-06-21 LAB — CBC
HCT: 30 % — ABNORMAL LOW (ref 35.0–47.0)
Hemoglobin: 10.7 g/dL — ABNORMAL LOW (ref 12.0–16.0)
MCH: 31.8 pg (ref 26.0–34.0)
MCHC: 35.6 g/dL (ref 32.0–36.0)
MCV: 89.4 fL (ref 80.0–100.0)
PLATELETS: 157 10*3/uL (ref 150–440)
RBC: 3.36 MIL/uL — AB (ref 3.80–5.20)
RDW: 13 % (ref 11.5–14.5)
WBC: 9.9 10*3/uL (ref 3.6–11.0)

## 2016-06-21 NOTE — Anesthesia Post-op Follow-up Note (Cosign Needed)
  Anesthesia Pain Follow-up Note  Patient: Susan Higgins  Day #: 1  Date of Follow-up: 06/21/2016 Time: 8:25 AM  Last Vitals:  Vitals:   06/21/16 0425 06/21/16 0805  BP: 111/72 118/80  Pulse: 66 75  Resp: 18 18  Temp: 36.7 C 36.9 C    Level of Consciousness: alert  Pain: none   Side Effects:None  Catheter Site Exam:clean, dry, no drainage     Plan: D/C from anesthesia care at surgeon's request  Zachary George

## 2016-06-21 NOTE — Progress Notes (Signed)
  Subjective:   Doing well.  No complaints.  Voiding, ambulating, tolerating regular PO diet, tolerating pain with PO meds. Denies: CP SOB F/C, N/V, calf pain  Denies: HA, visual changes, SOB, or RUQ/epigastric pain    Objective:  Blood pressure 118/80, pulse 75, temperature 98.4 F (36.9 C), temperature source Oral, resp. rate 18, height  (1.6 m), weight 105.2 kg (232 lb), last menstrual period 10/06/2015, SpO2 99 %.  General: NAD Pulmonary: no increased work of breathing Abdomen: non-distended, non-tender, fundus firm at level of umbilicus Incision: covered in woundvac, intact, activated. Extremities: no edema, no erythema, no tenderness  Results for orders placed or performed during the hospital encounter of 06/20/16 (from the past 24 hour(s))  Glucose, capillary     Status: None   Collection Time: 06/20/16 10:08 AM  Result Value Ref Range   Glucose-Capillary 87 65 - 99 mg/dL  CBC     Status: Abnormal   Collection Time: 06/21/16  5:12 AM  Result Value Ref Range   WBC 9.9 3.6 - 11.0 K/uL   RBC 3.36 (L) 3.80 - 5.20 MIL/uL   Hemoglobin 10.7 (L) 12.0 - 16.0 g/dL   HCT 16.1 (L) 09.6 - 04.5 %   MCV 89.4 80.0 - 100.0 fL   MCH 31.8 26.0 - 34.0 pg   MCHC 35.6 32.0 - 36.0 g/dL   RDW 40.9 81.1 - 91.4 %   Platelets 157 150 - 440 K/uL    Intake/Output Summary (Last 24 hours) at 06/21/16 0947 Last data filed at 06/21/16 0436  Gross per 24 hour  Intake           1412.5 ml  Output             4425 ml  Net          -3012.5 ml     Assessment:   31 y.o. [id number] postoperativeday # 1 from repeat LTCS and BTL   Plan:  1) Acute blood loss anemia - hemodynamically stable and asymptomatic - po ferrous sulfate  2) post partum cares - continue routine   3) get OOB, can shower - keep wound vac dry  4) Disposition: continue inpatient admission  ----- Ranae Plumber, MD Attending Obstetrician and Gynecologist [facility], Department of OB/GYN [facility]

## 2016-06-21 NOTE — Anesthesia Postprocedure Evaluation (Signed)
Anesthesia Post Note  Patient: Susan Higgins  Procedure(s) Performed: Procedure(s) (LRB): CESAREAN SECTION WITH BILATERAL TUBAL LIGATION (Bilateral)  Patient location during evaluation: Mother Baby Anesthesia Type: Spinal Level of consciousness: awake, awake and alert and oriented Pain management: pain level controlled Vital Signs Assessment: post-procedure vital signs reviewed and stable Respiratory status: spontaneous breathing, nonlabored ventilation and respiratory function stable Cardiovascular status: stable Postop Assessment: no headache, spinal receding, no signs of nausea or vomiting, patient able to bend at knees and adequate PO intake Anesthetic complications: no     Last Vitals:  Vitals:   06/21/16 0425 06/21/16 0805  BP: 111/72 118/80  Pulse: 66 75  Resp: 18 18  Temp: 36.7 C 36.9 C    Last Pain:  Vitals:   06/21/16 0805  TempSrc: Oral  PainSc:                  Zachary George

## 2016-06-22 MED ORDER — IBUPROFEN 600 MG PO TABS: 600.0000 mg | ORAL_TABLET | Freq: Four times a day (QID) | ORAL | 0 refills | Status: DC

## 2016-06-22 MED ORDER — OXYCODONE HCL 5 MG PO TABS: 5.0000 mg | ORAL_TABLET | ORAL | 0 refills | Status: DC | PRN

## 2016-06-22 NOTE — Progress Notes (Signed)
Discharge instructions complete and prescriptions given. Patient verbalizes understanding of teaching. Patient discharged home at 1330. 

## 2016-06-23 LAB — SURGICAL PATHOLOGY

## 2016-07-03 ENCOUNTER — Inpatient Hospital Stay: Admission: RE | Admit: 2016-07-03 | Payer: BC Managed Care – PPO | Source: Ambulatory Visit

## 2016-12-20 ENCOUNTER — Ambulatory Visit
Admission: EM | Admit: 2016-12-20 | Discharge: 2016-12-20 | Disposition: A | Discharge status: Home / Self Care | Payer: BC Managed Care – PPO | Attending: Emergency Medicine | Admitting: Emergency Medicine

## 2016-12-20 ENCOUNTER — Encounter: Payer: Self-pay | Admitting: Emergency Medicine

## 2016-12-20 DIAGNOSIS — L089 Local infection of the skin and subcutaneous tissue, unspecified: Secondary | ICD-10-CM | POA: Diagnosis present

## 2016-12-20 DIAGNOSIS — Z789 Other specified health status: Secondary | ICD-10-CM | POA: Diagnosis not present

## 2016-12-20 DIAGNOSIS — R030 Elevated blood-pressure reading, without diagnosis of hypertension: Secondary | ICD-10-CM | POA: Diagnosis not present

## 2016-12-20 MED ORDER — MUPIROCIN 2 % EX OINT: TOPICAL_OINTMENT | CUTANEOUS | 0 refills | Status: DC

## 2016-12-20 MED ORDER — SULFAMETHOXAZOLE-TRIMETHOPRIM 800-160 MG PO TABS: 1.0000 | ORAL_TABLET | Freq: Two times a day (BID) | ORAL | 0 refills | Status: DC

## 2016-12-20 NOTE — Discharge Instructions (Signed)
Please follow up with your dermatologist regarding skin tag evaluation and excision. Use meds as directed. Bactrim is excreted in breast milk, but should be safe in healthy infants over 31 months of age. Drink plenty of water. Go to ER for new or worsening issues.

## 2016-12-20 NOTE — ED Provider Notes (Signed)
[facility]    CSN: [id number] Arrival date & time: 12/20/16  0803     History   Chief Complaint Chief Complaint  Patient presents with  . bump on leg    HPI Susan Higgins is a 31 y.o. female.   31 yr old female presents to UC with cc of right inner leg skin tag that is irritated and painful, states it has been there for over a year, worse x 1 day now with serosanguinous drainage and redness around site of skin tag. Pt denies fever, N,V,D.    The history is provided by the patient. No language interpreter was used.    Past Medical History:  Diagnosis Date  . Gestational diabetes   . Hypertension   . PONV (postoperative nausea and vomiting)     Patient Active Problem List   Diagnosis Date Noted  . Skin infection 12/20/2016  . Breastfeeding (infant) 12/20/2016  . Labor and delivery, indication for care 06/20/2016  . Preeclampsia 06/20/2016  . Rh negative state in antepartum period 06/20/2016  . GDM (gestational diabetes mellitus), class A1 06/20/2016  . Postpartum care following cesarean delivery 06/20/2016  . Skull fracture (HCC) 05/06/2016  . BMI 39.0-39.9,adult 12/10/2015  . Previous cesarean delivery affecting pregnancy, antepartum 12/10/2015    Past Surgical History:  Procedure Laterality Date  . CESAREAN SECTION  [id number]   x 2  . CESAREAN SECTION WITH BILATERAL TUBAL LIGATION Bilateral 06/20/2016   Procedure: CESAREAN SECTION WITH BILATERAL TUBAL LIGATION;  Surgeon: Elenora Fender Ward, MD;  Location: [facility] ORS;  Service: Obstetrics;  Laterality: Bilateral;  . WISDOM TOOTH EXTRACTION      OB History    Gravida Para Term Preterm AB Living   SAB TAB Ectopic Multiple Live Births           2       Home Medications    Prior to Admission medications   Medication Sig Start Date End Date Taking? Authorizing Provider  ibuprofen (ADVIL,MOTRIN) 600 MG tablet Take 1 tablet (600 mg total) by mouth every 6 (six) hours. 06/22/16    Schermerhorn, Ihor Austin, MD  loratadine-pseudoephedrine (CLARITIN-D 24-HOUR) 10-240 MG 24 hr tablet Take 1 tablet by mouth daily as needed for allergies.    [provider]  mupirocin ointment (BACTROBAN) 2 % Please apply to affected area bid x 7 days 12/20/16   Rana Hochstein, Para March, NP  oxyCODONE (OXY IR/ROXICODONE) 5 MG immediate release tablet Take 1 tablet (5 mg total) by mouth every 4 (four) hours as needed (pain scale 4-7). 06/22/16   Schermerhorn, Ihor Austin, MD  Prenatal Vit-Fe Fumarate-FA (PRENATAL PO) Take 1 tablet by mouth daily.    [provider]  sulfamethoxazole-trimethoprim (BACTRIM DS,SEPTRA DS) 800-160 MG tablet Take 1 tablet by mouth 2 (two) times daily. 12/20/16   Derya Dettmann, Para March, NP    Family History Family History  Problem Relation Age of Onset  . Hypertension Father     Social History Social History  Substance Use Topics  . Smoking status: Never Smoker  . Smokeless tobacco: Never Used  . Alcohol use No     Allergies   Patient has no known allergies.   Review of Systems Review of Systems  Constitutional: Negative for fever.  Skin: Positive for color change.  All other systems reviewed and are negative.    Physical Exam Triage Vital Signs ED Triage Vitals  Enc Vitals Group  BP 12/20/16 0824 (!) 130/93     Pulse Rate 12/20/16 0824 86     Resp 12/20/16 0824 16     Temp 12/20/16 0824 98.2 F (36.8 C)     Temp Source 12/20/16 0824 Oral     SpO2 12/20/16 0824 99 %     Weight 12/20/16 0822 220 lb (99.8 kg)     Height 12/20/16 0822  (1.6 m)     Head Circumference --      Peak Flow --      Pain Score 12/20/16 0823 4     Pain Loc --      Pain Edu? --      Excl. in GC? --    No data found.   Updated Vital Signs BP (!) 130/93 (BP Location: Right Arm)   Pulse 86   Temp 98.2 F (36.8 C) (Oral)   Resp 16   Ht  (1.6 m)   Wt 220 lb (99.8 kg)   SpO2 99%   BMI 38.97 kg/m   Visual Acuity Right Eye Distance:   Left  Eye Distance:   Bilateral Distance:    Right Eye Near:   Left Eye Near:    Bilateral Near:     Physical Exam  Constitutional: She is oriented to person, place, and time. She appears well-developed and well-nourished. She is active and cooperative. No distress.  HENT:  Head: Normocephalic.  Eyes: Pupils are equal, round, and reactive to light.  Neck: Trachea normal and normal range of motion.  Cardiovascular: Normal rate, regular rhythm and normal pulses.   Pulmonary/Chest: Effort normal.  Musculoskeletal: Normal range of motion. She exhibits no edema or tenderness.  Lymphadenopathy:    She has no cervical adenopathy.  Neurological: She is alert and oriented to person, place, and time. No cranial nerve deficit or sensory deficit. GCS eye subscore is 4. GCS verbal subscore is 5. GCS motor subscore is 6.  Skin: Skin is warm and dry. Lesion noted. No rash noted. There is erythema.     Psychiatric: She has a normal mood and affect. Her speech is normal and behavior is normal.  Nursing note and vitals reviewed.    UC Treatments / Results  Labs (all labs ordered are listed, but only abnormal results are displayed) Labs Reviewed - No data to display  EKG  EKG Interpretation None       Radiology No results found.  Procedures Procedures (including critical care time)  Medications Ordered in UC Medications - No data to display   Initial Impression / Assessment and Plan / UC Course  I have reviewed the triage vital signs and the nursing notes.  Pertinent labs & imaging results that were available during my care of the patient were reviewed by me and considered in my medical decision making (see chart for details).    Please follow up with your dermatologist regarding skin tag evaluation and excision. Use meds as directed. Bactrim is excreted in breast milk, but should be safe in healthy infants over 1 months of age. Drink plenty of water. Go to ER for new or worsening  issues. Recheck BP with PCP as it was elevated today in ofice, pt verbalized understanding to this provider.   Final Clinical Impressions(s) / UC Diagnoses   Final diagnoses:  Skin infection  Breastfeeding (infant)  Elevated blood pressure reading    New Prescriptions Discharge Medication List as of 12/20/2016  9:10 AM    START taking these medications  Details  mupirocin ointment (BACTROBAN) 2 % Please apply to affected area bid x 7 days, Normal    sulfamethoxazole-trimethoprim (BACTRIM DS,SEPTRA DS) 800-160 MG tablet Take 1 tablet by mouth 2 (two) times daily., Starting Sat 12/20/2016, Print         Controlled Substance Prescriptions Bonnye Fava, Para March, NP 12/20/16 1024

## 2016-12-20 NOTE — ED Triage Notes (Signed)
Patient in today c/o bump on back of right upper leg. Patient states it has been there for a year, but it has never bothered her before. Yesterday the area starting draining and is painful. Patient states it has a red circle around area.

## 2017-03-10 DIAGNOSIS — Z87442 Personal history of urinary calculi: Secondary | ICD-10-CM

## 2017-03-10 HISTORY — DX: Personal history of urinary calculi: Z87.442

## 2017-09-18 ENCOUNTER — Other Ambulatory Visit: Payer: Self-pay

## 2017-09-18 ENCOUNTER — Emergency Department
Admission: EM | Admit: 2017-09-18 | Discharge: 2017-09-18 | Disposition: A | Discharge status: Home / Self Care | Payer: BC Managed Care – PPO | Attending: Emergency Medicine | Admitting: Emergency Medicine

## 2017-09-18 ENCOUNTER — Emergency Department: Payer: BC Managed Care – PPO

## 2017-09-18 ENCOUNTER — Encounter: Payer: Self-pay | Admitting: Emergency Medicine

## 2017-09-18 DIAGNOSIS — R509 Fever, unspecified: Secondary | ICD-10-CM | POA: Diagnosis present

## 2017-09-18 DIAGNOSIS — N61 Mastitis without abscess: Secondary | ICD-10-CM

## 2017-09-18 DIAGNOSIS — I10 Essential (primary) hypertension: Secondary | ICD-10-CM | POA: Diagnosis not present

## 2017-09-18 LAB — URINALYSIS, COMPLETE (UACMP) WITH MICROSCOPIC
BACTERIA UA: NONE SEEN
Bilirubin Urine: NEGATIVE
Glucose, UA: NEGATIVE mg/dL
Ketones, ur: NEGATIVE mg/dL
Leukocytes, UA: NEGATIVE
Nitrite: NEGATIVE
PH: 7 (ref 5.0–8.0)
Protein, ur: NEGATIVE mg/dL
SPECIFIC GRAVITY, URINE: 1.005 (ref 1.005–1.030)

## 2017-09-18 LAB — LACTIC ACID, PLASMA: LACTIC ACID, VENOUS: 0.8 mmol/L (ref 0.5–1.9)

## 2017-09-18 LAB — CBC WITH DIFFERENTIAL/PLATELET
Basophils Absolute: 0.1 10*3/uL (ref 0–0.1)
Basophils Relative: 1 %
EOS PCT: 3 %
Eosinophils Absolute: 0.5 10*3/uL (ref 0–0.7)
HEMATOCRIT: 41.6 % (ref 35.0–47.0)
Hemoglobin: 14.4 g/dL (ref 12.0–16.0)
LYMPHS ABS: 1 10*3/uL (ref 1.0–3.6)
LYMPHS PCT: 6 %
MCH: 29.9 pg (ref 26.0–34.0)
MCHC: 34.6 g/dL (ref 32.0–36.0)
MCV: 86.3 fL (ref 80.0–100.0)
Monocytes Absolute: 1 10*3/uL — ABNORMAL HIGH (ref 0.2–0.9)
Monocytes Relative: 6 %
Neutro Abs: 13.2 10*3/uL — ABNORMAL HIGH (ref 1.4–6.5)
Neutrophils Relative %: 84 %
PLATELETS: 236 10*3/uL (ref 150–440)
RBC: 4.83 MIL/uL (ref 3.80–5.20)
RDW: 12.9 % (ref 11.5–14.5)
WBC: 15.9 10*3/uL — ABNORMAL HIGH (ref 3.6–11.0)

## 2017-09-18 LAB — COMPREHENSIVE METABOLIC PANEL
ALBUMIN: 4.3 g/dL (ref 3.5–5.0)
ALT: 22 U/L (ref 0–44)
AST: 19 U/L (ref 15–41)
Alkaline Phosphatase: 60 U/L (ref 38–126)
Anion gap: 8 (ref 5–15)
BUN: 12 mg/dL (ref 6–20)
CHLORIDE: 104 mmol/L (ref 98–111)
CO2: 23 mmol/L (ref 22–32)
Calcium: 8.8 mg/dL — ABNORMAL LOW (ref 8.9–10.3)
Creatinine, Ser: 1.01 mg/dL — ABNORMAL HIGH (ref 0.44–1.00)
GFR calc Af Amer: >60 mL/min (ref >60)
GFR calc non Af Amer: >60 mL/min (ref >60)
GLUCOSE: 106 mg/dL — AB (ref 70–99)
POTASSIUM: 3.3 mmol/L — AB (ref 3.5–5.1)
Sodium: 135 mmol/L (ref 135–145)
Total Bilirubin: 0.8 mg/dL (ref 0.3–1.2)
Total Protein: 7.3 g/dL (ref 6.5–8.1)

## 2017-09-18 MED ORDER — KETOROLAC TROMETHAMINE 30 MG/ML IJ SOLN
15.0000 mg | Freq: Once | INTRAMUSCULAR | Status: AC
  Administered 2017-09-18: 15 mg via INTRAVENOUS

## 2017-09-18 MED ORDER — CLINDAMYCIN PHOSPHATE 600 MG/50ML IV SOLN
600.0000 mg | Freq: Once | INTRAVENOUS | Status: AC
  Administered 2017-09-18: 600 mg via INTRAVENOUS
  Filled 2017-09-18: qty 50

## 2017-09-18 MED ORDER — KETOROLAC TROMETHAMINE 30 MG/ML IJ SOLN: 15.0000 mg | Freq: Once | INTRAMUSCULAR | Status: DC

## 2017-09-18 MED ORDER — KETOROLAC TROMETHAMINE 30 MG/ML IJ SOLN
INTRAMUSCULAR | Status: AC
  Filled 2017-09-18: qty 1

## 2017-09-18 MED ORDER — SODIUM CHLORIDE 0.9 % IV BOLUS
1000.0000 mL | Freq: Once | INTRAVENOUS | Status: AC
  Administered 2017-09-18: 1000 mL via INTRAVENOUS

## 2017-09-18 MED ORDER — CLINDAMYCIN HCL 300 MG PO CAPS: 300.0000 mg | ORAL_CAPSULE | Freq: Three times a day (TID) | ORAL | 0 refills | Status: AC

## 2017-09-18 MED ORDER — SODIUM CHLORIDE 0.9 % IV SOLN
1.0000 g | Freq: Once | INTRAVENOUS | Status: AC
  Administered 2017-09-18: 1 g via INTRAVENOUS
  Filled 2017-09-18: qty 10

## 2017-09-18 MED ORDER — ONDANSETRON HCL 4 MG/2ML IJ SOLN
4.0000 mg | Freq: Once | INTRAMUSCULAR | Status: AC
  Administered 2017-09-18: 4 mg via INTRAVENOUS
  Filled 2017-09-18: qty 2

## 2017-09-18 NOTE — ED Provider Notes (Signed)
[facility] Emergency Department Provider Note  ____________________________________________  Time seen: Approximately 3:51 PM  I have reviewed the triage vital signs and the nursing notes.   HISTORY  Chief Complaint Fever   HPI Susan Higgins K Etheleen MayhewLeach is a 32 y.o. female with a history of hypertension who presents for evaluation of fever and breast pain.  Patient reports that she was sent here by her OB/GYN for concerns of mastitis and need for IV antibiotics.  Patient was seen by her OB/GYN 3 days ago having dysuria.  Her urine culture grew Streptococcus gallolyticus. Patient is on keflex.  Her urinary symptoms have resolved.  Yesterday she started having left breast pain which looked red and warm and started having a fever as high as 102F.  She last took antipyretics at 9 AM this morning.  She is complaining of generalized body aches, headache, and feeling unwell.  The pain in her breast is moderate, constant, throbbing.  Patient is breast-feeding her 1921-month-old infant.  Her OB/GYN was concerned of possible bacteremia from the urine bacteria now seeding on her breast and recommend she came to the emergency room for IV antibiotics.    Past Medical History:  Diagnosis Date  . Gestational diabetes   . Hypertension   . PONV (postoperative nausea and vomiting)     Patient Active Problem List   Diagnosis Date Noted  . Skin infection 12/20/2016  . Breastfeeding (infant) 12/20/2016  . Labor and delivery, indication for care 06/20/2016  . Preeclampsia 06/20/2016  . Rh negative state in antepartum period 06/20/2016  . GDM (gestational diabetes mellitus), class A1 06/20/2016  . Postpartum care following cesarean delivery 06/20/2016  . Skull fracture (HCC) 05/06/2016  . BMI 39.0-39.9,adult 12/10/2015  . Previous cesarean delivery affecting pregnancy, antepartum 12/10/2015    Past Surgical History:  Procedure Laterality Date  . CESAREAN SECTION  [id number]   x 2  .  CESAREAN SECTION WITH BILATERAL TUBAL LIGATION Bilateral 06/20/2016   Procedure: CESAREAN SECTION WITH BILATERAL TUBAL LIGATION;  Surgeon: Elenora Fenderhelsea C Ward, MD;  Location: [facility] ORS;  Service: Obstetrics;  Laterality: Bilateral;  . WISDOM TOOTH EXTRACTION      Prior to Admission medications   Medication Sig Start Date End Date Taking? Authorizing Provider  calcium carbonate (TUMS - DOSED IN MG ELEMENTAL CALCIUM) 500 MG chewable tablet Chew 2 tablets by mouth daily.   Yes [provider]  cephALEXin (KEFLEX) 500 MG capsule Take 500 mg by mouth 4 (four) times daily. 09/15/17 09/25/17 Yes [provider]  ibuprofen (ADVIL,MOTRIN) 200 MG tablet Take 200-400 mg by mouth every 6 (six) hours as needed for mild pain or moderate pain.   Yes [provider]  clindamycin (CLEOCIN) 300 MG capsule Take 1 capsule (300 mg total) by mouth 3 (three) times daily for 10 days. 09/18/17 09/28/17  Nita SickleVeronese, [place], MD    Allergies Patient has no known allergies.  Family History  Problem Relation Age of Onset  . Hypertension Father     Social History Social History   Tobacco Use  . Smoking status: Never Smoker  . Smokeless tobacco: Never Used  Substance Use Topics  . Alcohol use: No    Alcohol/week: 0.0 oz  . Drug use: No    Review of Systems  Constitutional: + fever and body aches Eyes: Negative for visual changes. ENT: Negative for sore throat. Neck: No neck pain  Breast: + L breast pain and redness Cardiovascular: Negative for chest pain. Respiratory: Negative for shortness  of breath. Gastrointestinal: Negative for abdominal pain, vomiting or diarrhea. + nausea Genitourinary: Negative for dysuria. Musculoskeletal: Negative for back pain. Skin: Negative for rash. Neurological: + headaches. No weakness or numbness. Psych: No SI or HI  ____________________________________________   PHYSICAL EXAM:  VITAL SIGNS: ED Triage Vitals  Enc Vitals Group     BP 09/18/17  1430 135/87     Pulse Rate 09/18/17 1430 (!) 108     Resp 09/18/17 1430 16     Temp 09/18/17 1430 99.8 F (37.7 C)     Temp Source 09/18/17 1430 Oral     SpO2 --      Weight 09/18/17 1428 229 lb (103.9 kg)     Height 09/18/17 1428 5\' 2"  (1.575 m)     Head Circumference --      Peak Flow --      Pain Score 09/18/17 1428 8     Pain Loc --      Pain Edu? --      Excl. in GC? --     Constitutional: Alert and oriented. Well appearing and in no apparent distress. HEENT:      Head: Normocephalic and atraumatic.         Eyes: Conjunctivae are normal. Sclera is non-icteric.       Mouth/Throat: Mucous membranes are moist.       Neck: Supple with no signs of meningismus. Breast: Left breast is warm and erythematous and tender to palpation Cardiovascular: Tachycardic with regular rhythm. No murmurs, gallops, or rubs. 2+ symmetrical distal pulses are present in all extremities. No JVD. Respiratory: Normal respiratory effort. Lungs are clear to auscultation bilaterally. No wheezes, crackles, or rhonchi.  Gastrointestinal: Soft, non tender, and non distended with positive bowel sounds. No rebound or guarding. Musculoskeletal: Nontender with normal range of motion in all extremities. No edema, cyanosis, or erythema of extremities. Neurologic: Normal speech and language. Face is symmetric. Moving all extremities. No gross focal neurologic deficits are appreciated. Skin: Skin is warm, dry and intact. No rash noted. Psychiatric: Mood and affect are normal. Speech and behavior are normal.  ____________________________________________   LABS (all labs ordered are listed, but only abnormal results are displayed)  Labs Reviewed  COMPREHENSIVE METABOLIC PANEL - Abnormal; Notable for the following components:      Result Value   Potassium 3.3 (*)    Glucose, Bld 106 (*)    Creatinine, Ser 1.01 (*)    Calcium 8.8 (*)    All other components within normal limits  CBC WITH DIFFERENTIAL/PLATELET -  Abnormal; Notable for the following components:   WBC 15.9 (*)    Neutro Abs 13.2 (*)    Monocytes Absolute 1.0 (*)    All other components within normal limits  URINALYSIS, COMPLETE (UACMP) WITH MICROSCOPIC - Abnormal; Notable for the following components:   Color, Urine STRAW (*)    APPearance CLEAR (*)    Hgb urine dipstick SMALL (*)    All other components within normal limits  CULTURE, BLOOD (SINGLE)  CULTURE, BLOOD (SINGLE)  URINE CULTURE  LACTIC ACID, PLASMA  POC URINE PREG, ED   ____________________________________________  EKG  none  ____________________________________________  RADIOLOGY  I have personally reviewed the images performed during this visit and I agree with the Radiologist's read.   Interpretation by Radiologist:  Dg Chest 2 View  Result Date: 09/18/2017 CLINICAL DATA:  Fever for 1 day.  Positive strep test. EXAM: CHEST - 2 VIEW COMPARISON:  None. FINDINGS: The lungs are  clear. Heart size is normal. No pneumothorax or pleural effusion. No focal bony abnormality. IMPRESSION: Normal chest. Electronically Signed   By: Drusilla Kanner M.D.   On: 09/18/2017 15:20     ____________________________________________   PROCEDURES  Procedure(s) performed: None Procedures Critical Care performed:  None ____________________________________________   INITIAL IMPRESSION / ASSESSMENT AND PLAN / ED COURSE  32 y.o. female with a history of hypertension who presents for evaluation of fever and breast pain.  Her Obgyn is concerned for possible bacteremia causing her UTI and now mastitis especially with an atypical bacteria growing in the urine.  At this time patient has a low-grade fever of 99.8, she is tachycardic with a pulse of 108 and has a white count of 15.9. Lactate is normal. Discussed with Dr. Elesa Massed who wanted patient to be evaluated for possible bacteremia and also to receive IV abx. I will give IV rocephin and clindamycin. Blood cultures have been sent. UA  is clean. Patient has no meningeal signs, patient is otherwise extremely well appearing. Urine culture showed between 50-100K colonies of Group D Strep.   Clinical Course as of Sep 18 1853  Fri Sep 18, 2017  0981 Patient feels improved.  I offered admission for close monitoring IV antibiotics although she does not meet sepsis criteria but patient prefers to go home.  She remains with a low-grade temp but has not spiked a fever.  Her heart rate has normalized after fluids.  Her lactic is normal at 0.8.  Cultures are pending.  Patient was given a dose of IV Rocephin and IV clindamycin.  She is going to be discharged home on Keflex and Clinda.  Recommend close follow-up to the emergency department if she spikes a fever 24 hours after initiation of IV antibiotics or if she has worsening symptoms.  Otherwise she will follow-up with her OB/GYN on Monday.  Recommend pumping and dumping breast milk while on clindamycin as it will go to the baby.   [CV]    Clinical Course User Index [CV] Don Perking [place], MD     As part of my medical decision making, I reviewed the following data within the electronic MEDICAL RECORD NUMBER Nursing notes reviewed and incorporated, Labs reviewed , Old chart reviewed, Radiograph reviewed , A consult was requested and obtained from this/these consultant(s) ObGyn, Notes from prior ED visits and [place] Controlled Substance Database    Pertinent labs & imaging results that were available during my care of the patient were reviewed by me and considered in my medical decision making (see chart for details).    ____________________________________________   FINAL CLINICAL IMPRESSION(S) / ED DIAGNOSES  Final diagnoses:  Fever, unspecified fever cause  Mastitis, left, acute      NEW MEDICATIONS STARTED DURING THIS VISIT:  ED Discharge Orders        Ordered    clindamycin (CLEOCIN) 300 MG capsule  3 times daily     09/18/17 1848       Note:  This document was prepared  using Dragon voice recognition software and may include unintentional dictation errors.    Don Perking, [place], MD 09/18/17 [phone]

## 2017-09-18 NOTE — ED Triage Notes (Signed)
Sent to ED by PCP because urine culture came back positive for Strep.  Sent to ED for IV antibiotics.    Patient states she has had fever x 1 day and left breast redness.  Patient is breast feeding.

## 2017-09-18 NOTE — Discharge Instructions (Addendum)
If you have a fever 24 hours from now please return to the ER for further evaluation. Otherwise take clindamycin and keflex and follow up with Dr. Elesa MassedWard on Monday.  Return to the emergency room if you have worsening pain in your breast, worsening redness.  While on clindamycin please pump the breast milk and dump it.  Do not feel it to the baby.

## 2017-09-19 LAB — URINE CULTURE: Culture: NO GROWTH

## 2017-09-23 LAB — CULTURE, BLOOD (SINGLE)
Culture: NO GROWTH
Culture: NO GROWTH
Special Requests: ADEQUATE

## 2019-05-26 IMAGING — CR DG CHEST 2V
2 series · 2 of 2 positions shown · non-contrast
Comparison: None.

CLINICAL DATA: Fever for 1 day.  Positive strep test.

EXAM:
CHEST - 2 VIEW

[chest pa]
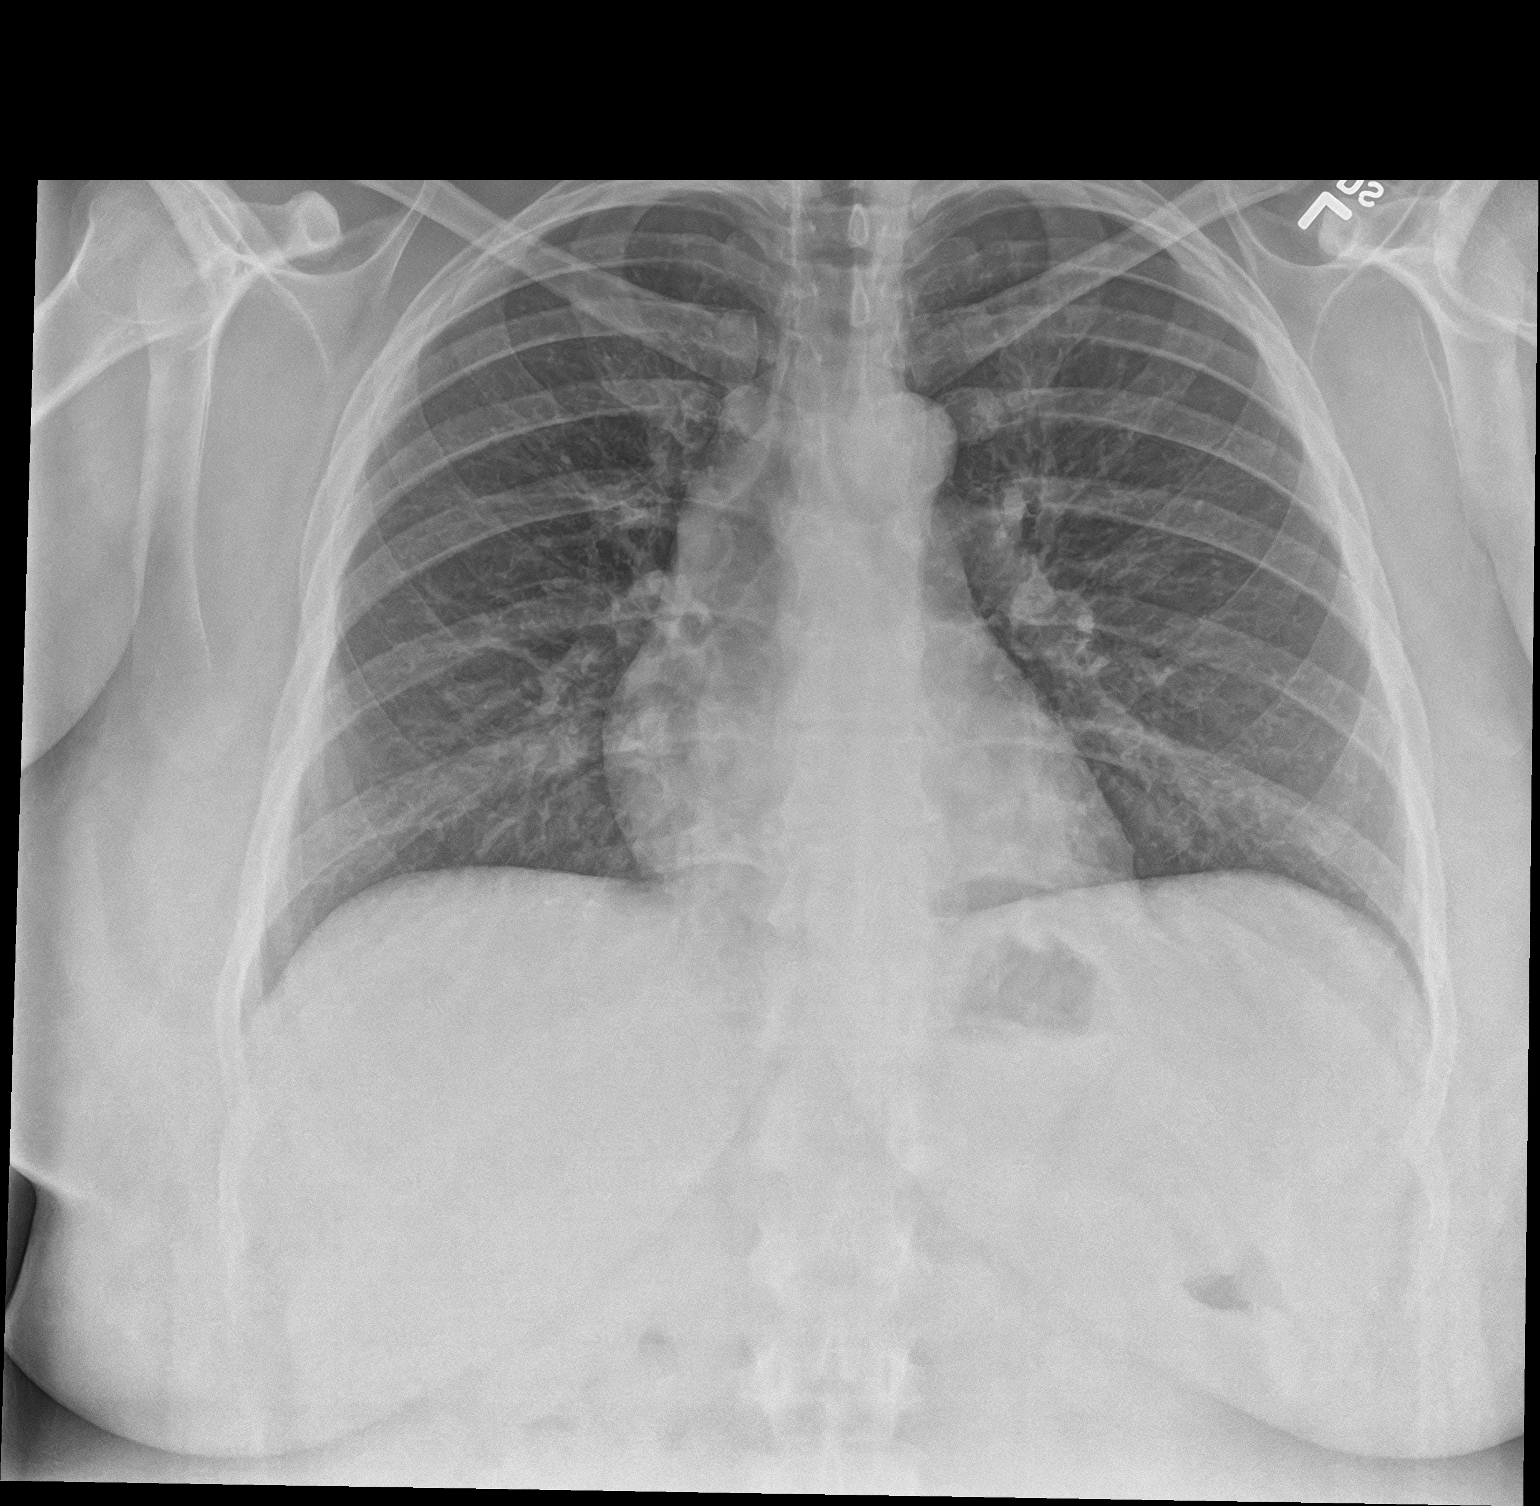

[chest lat]
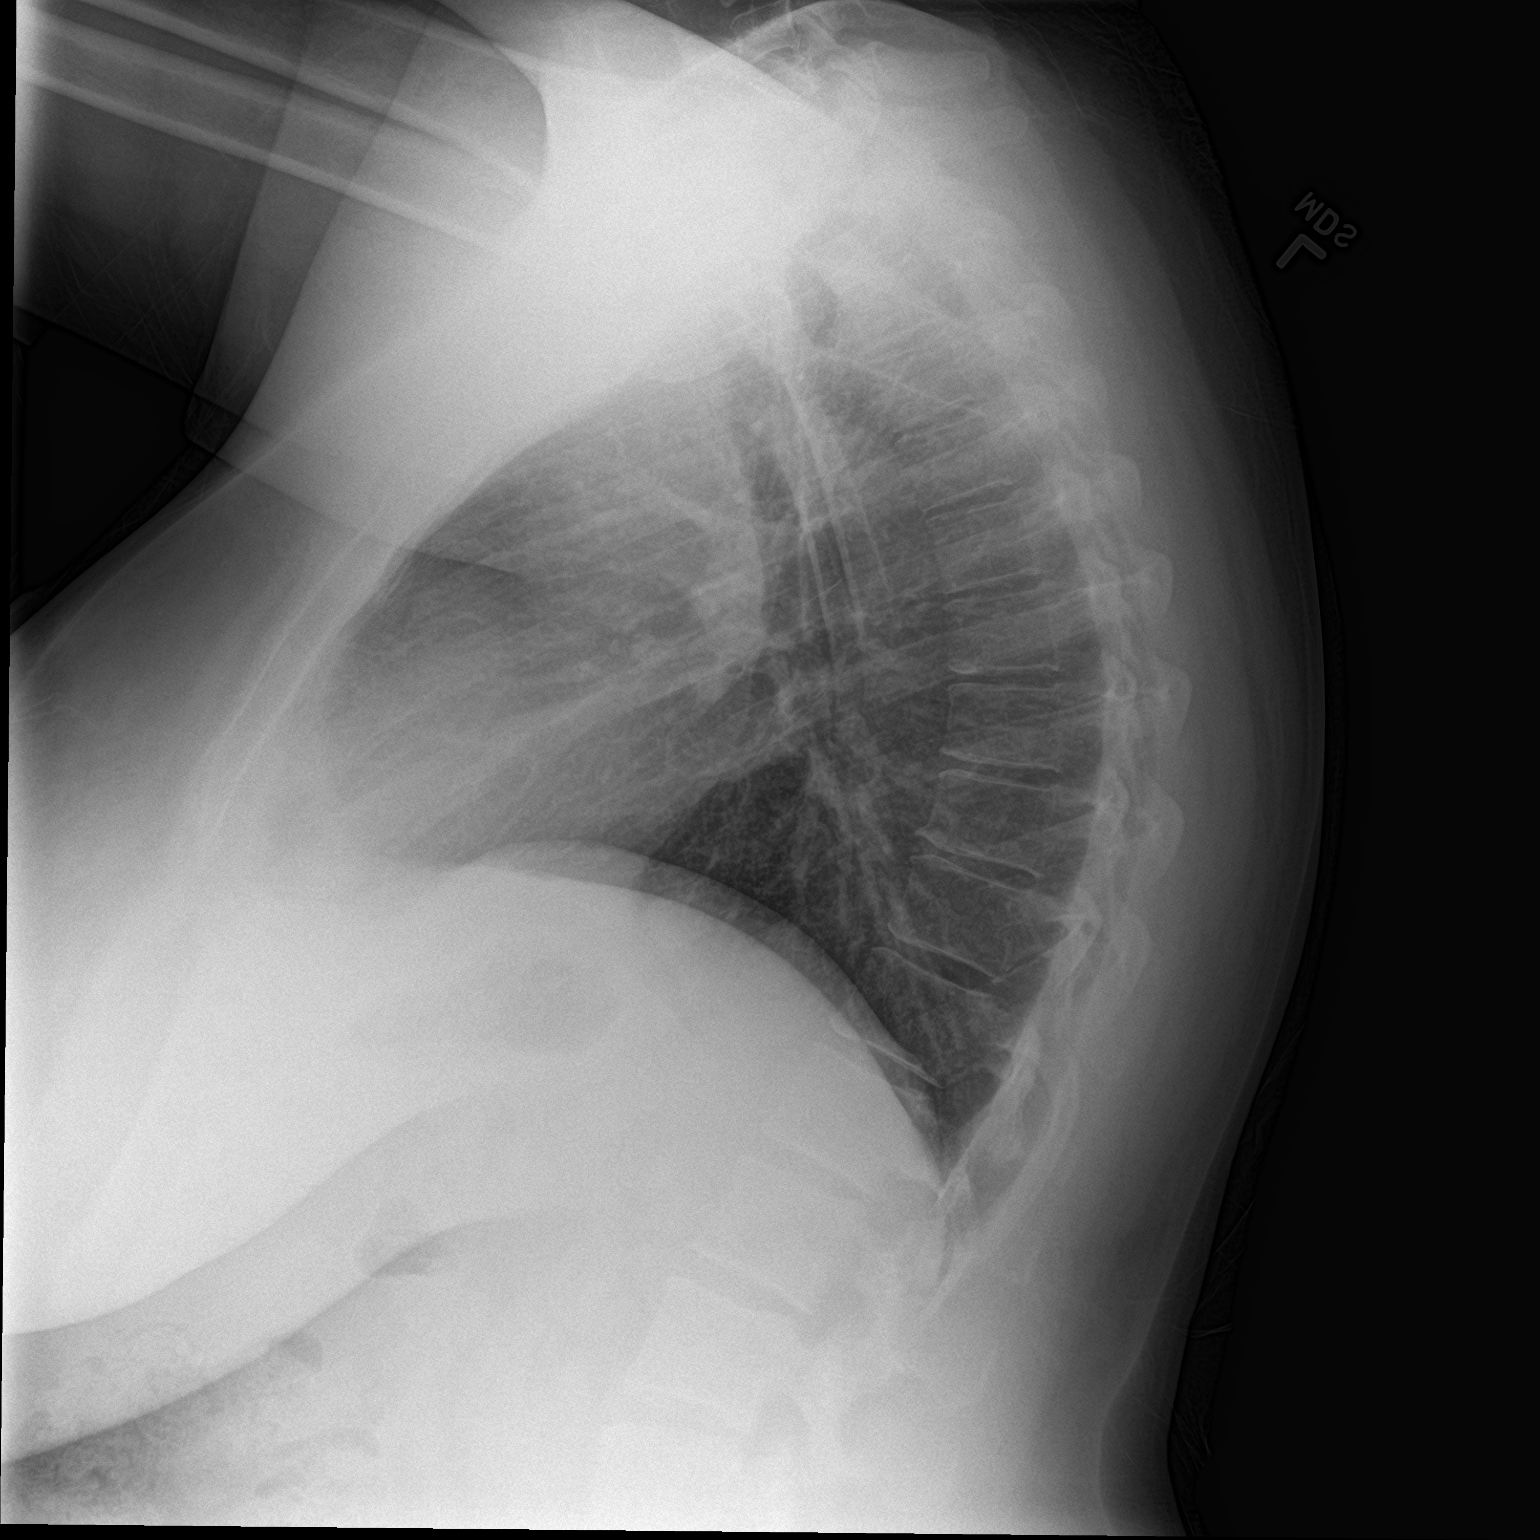

[2 of 2 positions shown; findings below may reference images not displayed]

FINDINGS: The lungs are clear. Heart size is normal. No pneumothorax or
pleural effusion. No focal bony abnormality.
IMPRESSION: Normal chest.

## 2019-12-24 NOTE — H&P (Signed)
Chief Complaint:    Patient ID: Susan Higgins is a 34 y.o. female presenting with Surgical Consult  on 10/05/2019  HPI: 09/20/2019: saw Heloise Ochoa, CNM for AUB; ultrasound: thickened endometrium. Rx provera.   Abnormal Cycles:  April 12-normal period 5/30- 2 days light flow only.  6/17- 3 weeks moderate to heavy bleeding with clots 7/8 and restarted bleeding 7/13  Cycle:Prior to April/May, 28-30 days Length: 4-5 days Flow: moderate  Last pap smear:01/2016 NILM, HPV neg History of abnormal pap smears: no Sexually active:yes Current contraception:BTL 06/2016 with last CS History of sexually transmitted infections:denies   Today:  Bleeding much lighter with provera, but still present  Fears having failed endometrial ablation. Just wants to be done with bleeding forever.   Wonders if periods are abnormal since she stopped breast feeding in April    Workup:  Pap: 2017 neg/neg, no hx abnormal  EMBx: benign, progestin effect  09/20/2019 TVUS:  Uterus anteverted 9 x 5 x 5 cm  EE 19 mm LO 3 x 2 x 1.5 cm RO 4 x 3.5 x 2 cm with simple cyst 3 cm      Past Medical History:  has a past medical history of Gestational hypertension, Obesity, Parvovirus exposure, Rh negative state in antepartum period, and Skull fracture (CMS-HCC).  Past Surgical History:  has a past surgical history that includes wisdom teeth extraction (2002); Cesarean section; tubal ligation with cesarean (Bilateral, 06/20/2016); and pr cysto/uretero w/lithotripsy &indwell stent insrt (Left, 11/20/2017). Family History: family history includes Colon polyps in her father and paternal grandfather; Glaucoma in her paternal grandmother; High blood pressure (Hypertension) in her father; Hyperlipidemia (Elevated cholesterol) in her maternal grandfather, paternal grandfather, and paternal grandmother; Obesity in her paternal grandmother; Thyroid disease in her paternal grandmother. Social History:   reports that she has never smoked. She has never used smokeless tobacco. She reports that she does not drink alcohol and does not use drugs. OB/GYN History:          OB History    Gravida  3   Para  3   Term  3   Preterm  0   AB  0   Living  3     SAB  0   TAB  0   Ectopic  0   Molar  0   Multiple  0   Live Births  3          Allergies: has No Known Allergies. Medications:  Current Outpatient Medications:  .  medroxyPROGESTERone (PROVERA) 10 MG tablet, Take 1 tablet (10 mg total) by mouth once daily for 90 days Provera 10 mg  1 tablet daily x 10 days every 3 months, Disp: 90 tablet, Rfl: 1 .  oxybutynin (DITROPAN) 5 mg tablet, Take 1 tablet (5 mg total) by mouth 3 (three) times daily as needed For use after your surgery for bladder spasm, Disp: 20 tablet, Rfl: 0 .  tamsulosin (FLOMAX) 0.4 mg capsule, Take 1 capsule (0.4 mg total) by mouth once daily Begin day after surgery and continue until 2 days after stent removal, Disp: 14 capsule, Rfl: 0   Review of Systems: No SOB, no palpitations or chest pain, no new lower extremity edema, no nausea or vomiting or bowel or bladder complaints. See HPI for gyn specific ROS.   Exam:    Constitutional: BP 138/78   Ht 160 cm (5' 2.99")   Wt (!) 109.7 kg (241 lb 12.8 oz)   BMI 42.84 kg/m  WDWN female in NAD   HEENT: sclera clear, non-icteric, moist mucous membranes, dentition intact Endocrine:  no thyromegaly Respiratory: normal respiratory effort, CTABL    CV: no peripheral edema, RRR no MRG GI: soft , no mass, non-tender, no rebound tenderness  GU: tanner stage 5 , LONG PEDERSON CERVIX DEEP, AIM UP             External genitalia/skin: vulva /labia no lesions             Lymphatic: no enlarged inguinal nodes bilaterally             Urethra: no prolapse, no diverticulum, no caruncle             Bladder: no tenderness to palpation, no cystocele             Vagina: normal physiologic d/c, no lesions,  normal apical support             Cervix: no lesions, no cervical motion tenderness high in apex of vagina, small and nulliparous              Uterus: anteverted, normal size shape and contour, non-tender, exam limited by habitus              Adnexa: no masses bilaterally, non-tender, exam limited by habitus   Skin: warm and well perfused, no rashes Neuro: alert, oriented x3,   Psych: appropriate mood and insight, judgement intact   Impression:   The primary encounter diagnosis was Preoperative exam for gynecologic surgery. A diagnosis of Abnormal uterine bleeding (AUB) was also pertinent to this visit.   Plan:   Abnormal Uterine Bleeding  Offered in office endometrial ablation; would prefer a hysterectomy Preoperative exam with endometrial biopsy today  Increasing provera to 10 mg x twice daily  Labs ordered for further investigation of cause of abnormal uterine bleeding. I will do a hysterectomy if this is desired, but have asked her to consider a D&C with IUD insertion or endometrial ablation since it is possible her abnormal bleeding would resolve with the removal of the tissue, and with progstin IUD vs endometrial ablation her bleeding may cease completely.  She will consider these and update me as her OR date approaches; neither decision will alter the time spent in the OR.  Planned procedure: D&C hysteroscopy    The patient and I discussed the technical aspects of the procedure including the potential for risks and complications.These include but are not limited to the risk of infection requiring post-operative antibiotics or further procedures.We talked about the risk of injury to adjacent organs including bladder, bowel, blood vessels or nerves, perforation and possible laparoscopy, possibleneed for blood transfusion andpostop complications such asthromboembolic or cardiopulmonary complications.All of her questions were answered. Her preoperative exam was  completed. She is scheduled to undergo this procedure in the near future.    I personally performed the service. (TP)  Tharon Bomar CRIST Mateus Rewerts, MD    A portion of this note was dictated via scribe. The plan and decision making are mine.  Errors are unintentional.

## 2019-12-30 ENCOUNTER — Other Ambulatory Visit: Payer: Self-pay | Admitting: Obstetrics & Gynecology

## 2020-01-04 ENCOUNTER — Other Ambulatory Visit: Payer: Self-pay

## 2020-01-04 ENCOUNTER — Other Ambulatory Visit: Payer: BC Managed Care – PPO

## 2020-01-04 ENCOUNTER — Encounter
Admission: RE | Admit: 2020-01-04 | Discharge: 2020-01-04 | Disposition: A | Discharge status: Home / Self Care | Payer: BC Managed Care – PPO | Source: Ambulatory Visit | Attending: Obstetrics & Gynecology | Admitting: Obstetrics & Gynecology

## 2020-01-04 HISTORY — DX: Fatty (change of) liver, not elsewhere classified: K76.0

## 2020-01-04 HISTORY — DX: Gastro-esophageal reflux disease without esophagitis: K21.9

## 2020-01-04 HISTORY — DX: Unspecified maternal hypertension, third trimester: O16.3

## 2020-01-04 NOTE — Patient Instructions (Addendum)
Your procedure is scheduled on:01-06-20 FRIDAY Report to Day Surgery on the 2nd floor of the [facility]. To find out your arrival time, please call [phone] between 1PM - 3PM on: 01-05-20 THURSDAY  REMEMBER: Instructions that are not followed completely may result in serious medical risk, up to and including death; or upon the discretion of your surgeon and anesthesiologist your surgery may need to be rescheduled.  Do not eat food after midnight the night before surgery.  No gum chewing, lozengers or hard candies.  You may however, drink CLEAR liquids up to 2 hours before you are scheduled to arrive for your surgery. Do not drink anything within 2 hours of your scheduled arrival time.  Clear liquids include: - water  - apple juice without pulp - gatorade (not RED, PURPLE, OR BLUE) - black coffee or tea (Do NOT add milk or creamers to the coffee or tea) Do NOT drink anything that is not on this list.  In addition, your doctor has ordered for you to drink the provided  Ensure Pre-Surgery Clear Carbohydrate Drink  Drinking this carbohydrate drink up to two hours before surgery helps to reduce insulin resistance and improve patient outcomes. Please complete drinking 2 hours prior to scheduled arrival time.  TAKE THESE MEDICATIONS THE MORNING OF SURGERY WITH A SIP OF WATER: -NONE  One week prior to surgery: Stop Anti-inflammatories (NSAIDS) such as Advil, Aleve, Ibuprofen, Motrin, Naproxen, Naprosyn and Aspirin based products such as Excedrin, Goodys Powder, BC Powder-OK TO TAKE TYLENOL IF NEEDED  Stop ANY OVER THE COUNTER supplements until after surgery.  No Alcohol for 24 hours before or after surgery.  No Smoking including e-cigarettes for 24 hours prior to surgery.  No chewable tobacco products for at least 6 hours prior to surgery.  No nicotine patches on the day of surgery.  Do not use any "recreational" drugs for at least a week prior to your surgery.  Please be  advised that the combination of cocaine and anesthesia may have negative outcomes, up to and including death. If you test positive for cocaine, your surgery will be cancelled.  On the morning of surgery brush your teeth with toothpaste and water, you may rinse your mouth with mouthwash if you wish. Do not swallow any toothpaste or mouthwash.  Do not wear jewelry, make-up, hairpins, clips or nail polish.  Do not wear lotions, powders, or perfumes.   Do not shave 48 hours prior to surgery.   Contact lenses, hearing aids and dentures may not be worn into surgery.  Do not bring valuables to the hospital. [facility] is not responsible for any missing/lost belongings or valuables.  Notify your doctor if there is any change in your medical condition (cold, fever, infection).  Wear comfortable clothing (specific to your surgery type) to the hospital.  Plan for stool softeners for home use; pain medications have a tendency to cause constipation. You can also help prevent constipation by eating foods high in fiber such as fruits and vegetables and drinking plenty of fluids as your diet allows.  After surgery, you can help prevent lung complications by doing breathing exercises.  Take deep breaths and cough every 1-2 hours. Your doctor may order a device called an Incentive Spirometer to help you take deep breaths. When coughing or sneezing, hold a pillow firmly against your incision with both hands. This is called "splinting." Doing this helps protect your incision. It also decreases belly discomfort.  If you are being admitted to the  hospital overnight, leave your suitcase in the car. After surgery it may be brought to your room.  If you are being discharged the day of surgery, you will not be allowed to drive home. You will need a responsible adult (18 years or older) to drive you home and stay with you that night.   If you are taking public transportation, you will need to have a  responsible adult (18 years or older) with you. Please confirm with your physician that it is acceptable to use public transportation.   Please call the Pre-admissions Testing Dept. at [phone] if you have any questions about these instructions.  Visitation Policy:  Patients undergoing a surgery or procedure may have one family member or support person with them as long as that person is not COVID-19 positive or experiencing its symptoms.  That person may remain in the waiting area during the procedure.  Inpatient Visitation Update:   In an effort to ensure the safety of our team members and our patients, we are implementing a change to our visitation policy:  Effective Monday, Aug. 9, at 7 a.m., inpatients will be allowed one support person.  o The support person may change daily.  o The support person must pass our screening, gel in and out, and wear a mask at all times, including in the patient's room.  o Patients must also wear a mask when staff or their support person are in the room.  o Masking is required regardless of vaccination status.  Systemwide, no visitors 17 or younger.

## 2020-01-05 ENCOUNTER — Other Ambulatory Visit
Admission: RE | Admit: 2020-01-05 | Discharge: 2020-01-05 | Disposition: A | Discharge status: Home / Self Care | Payer: BC Managed Care – PPO | Source: Ambulatory Visit | Attending: Obstetrics & Gynecology | Admitting: Obstetrics & Gynecology

## 2020-01-05 DIAGNOSIS — Z20822 Contact with and (suspected) exposure to covid-19: Secondary | ICD-10-CM | POA: Insufficient documentation

## 2020-01-05 DIAGNOSIS — Z01812 Encounter for preprocedural laboratory examination: Secondary | ICD-10-CM | POA: Diagnosis not present

## 2020-01-05 LAB — TYPE AND SCREEN
ABO/RH(D): A NEG
Antibody Screen: NEGATIVE

## 2020-01-05 LAB — CBC
HCT: 40.9 % (ref 36.0–46.0)
Hemoglobin: 13.6 g/dL (ref 12.0–15.0)
MCH: 27.5 pg (ref 26.0–34.0)
MCHC: 33.3 g/dL (ref 30.0–36.0)
MCV: 82.8 fL (ref 80.0–100.0)
Platelets: 293 10*3/uL (ref 150–400)
RBC: 4.94 MIL/uL (ref 3.87–5.11)
RDW: 12.1 % (ref 11.5–15.5)
WBC: 4.9 10*3/uL (ref 4.0–10.5)
nRBC: 0 % (ref 0.0–0.2)

## 2020-01-05 LAB — BASIC METABOLIC PANEL
Anion gap: 9 (ref 5–15)
BUN: 9 mg/dL (ref 6–20)
CO2: 23 mmol/L (ref 22–32)
Calcium: 8.7 mg/dL — ABNORMAL LOW (ref 8.9–10.3)
Chloride: 106 mmol/L (ref 98–111)
Creatinine, Ser: 0.87 mg/dL (ref 0.44–1.00)
GFR, Estimated: >60 mL/min (ref >60)
Glucose, Bld: 113 mg/dL — ABNORMAL HIGH (ref 70–99)
Potassium: 3.9 mmol/L (ref 3.5–5.1)
Sodium: 138 mmol/L (ref 135–145)

## 2020-01-06 ENCOUNTER — Ambulatory Visit: Payer: BC Managed Care – PPO | Admitting: Certified Registered Nurse Anesthetist

## 2020-01-06 ENCOUNTER — Encounter: Payer: Self-pay | Admitting: Obstetrics & Gynecology

## 2020-01-06 ENCOUNTER — Encounter
Admission: RE | Disposition: A | Discharge status: Home / Self Care | Payer: Self-pay | Source: Home / Self Care | Attending: Obstetrics & Gynecology

## 2020-01-06 ENCOUNTER — Ambulatory Visit
Admission: RE | Admit: 2020-01-06 | Discharge: 2020-01-06 | Disposition: A | Discharge status: Home / Self Care | Payer: BC Managed Care – PPO | Attending: Obstetrics & Gynecology | Admitting: Obstetrics & Gynecology

## 2020-01-06 ENCOUNTER — Other Ambulatory Visit: Payer: Self-pay

## 2020-01-06 DIAGNOSIS — N939 Abnormal uterine and vaginal bleeding, unspecified: Secondary | ICD-10-CM | POA: Diagnosis present

## 2020-01-06 HISTORY — PX: DILITATION & CURRETTAGE/HYSTROSCOPY WITH NOVASURE ABLATION: SHX5568

## 2020-01-06 LAB — SARS CORONAVIRUS 2 (TAT 6-24 HRS): SARS Coronavirus 2: NEGATIVE

## 2020-01-06 LAB — POCT PREGNANCY, URINE: Preg Test, Ur: NEGATIVE

## 2020-01-06 SURGERY — DILATATION & CURETTAGE/HYSTEROSCOPY WITH NOVASURE ABLATION
Anesthesia: General

## 2020-01-06 MED ORDER — DEXAMETHASONE SODIUM PHOSPHATE 4 MG/ML IJ SOLN
INTRAMUSCULAR | Status: DC | PRN
  Administered 2020-01-06: 6 mg via INTRAVENOUS

## 2020-01-06 MED ORDER — MIDAZOLAM HCL 2 MG/2ML IJ SOLN
INTRAMUSCULAR | Status: DC | PRN
  Administered 2020-01-06: 2 mg via INTRAVENOUS

## 2020-01-06 MED ORDER — FAMOTIDINE 20 MG PO TABS
ORAL_TABLET | ORAL | Status: AC
  Administered 2020-01-06: 20 mg via ORAL
  Filled 2020-01-06: qty 1

## 2020-01-06 MED ORDER — PROPOFOL 500 MG/50ML IV EMUL
INTRAVENOUS | Status: DC | PRN
  Administered 2020-01-06: 120 ug/kg/min via INTRAVENOUS

## 2020-01-06 MED ORDER — LIDOCAINE HCL (CARDIAC) PF 100 MG/5ML IV SOSY
PREFILLED_SYRINGE | INTRAVENOUS | Status: DC | PRN
  Administered 2020-01-06: 80 mg via INTRAVENOUS

## 2020-01-06 MED ORDER — PROPOFOL 10 MG/ML IV BOLUS
INTRAVENOUS | Status: DC | PRN
  Administered 2020-01-06: 70 mg via INTRAVENOUS

## 2020-01-06 MED ORDER — GABAPENTIN 300 MG PO CAPS: 600.0000 mg | ORAL_CAPSULE | ORAL | Status: AC

## 2020-01-06 MED ORDER — ACETAMINOPHEN 500 MG PO TABS: 1000.0000 mg | ORAL_TABLET | ORAL | Status: AC

## 2020-01-06 MED ORDER — PROMETHAZINE HCL 25 MG/ML IJ SOLN: 6.2500 mg | INTRAMUSCULAR | Status: DC | PRN

## 2020-01-06 MED ORDER — KETOROLAC TROMETHAMINE 15 MG/ML IJ SOLN
INTRAMUSCULAR | Status: AC
  Administered 2020-01-06: 15 mg via INTRAVENOUS
  Filled 2020-01-06: qty 1

## 2020-01-06 MED ORDER — FENTANYL CITRATE (PF) 100 MCG/2ML IJ SOLN
INTRAMUSCULAR | Status: DC | PRN
  Administered 2020-01-06 (×4): 25 ug via INTRAVENOUS

## 2020-01-06 MED ORDER — ONDANSETRON HCL 4 MG/2ML IJ SOLN
INTRAMUSCULAR | Status: DC | PRN
  Administered 2020-01-06: 4 mg via INTRAVENOUS

## 2020-01-06 MED ORDER — PROPOFOL 500 MG/50ML IV EMUL
INTRAVENOUS | Status: AC
  Filled 2020-01-06: qty 50

## 2020-01-06 MED ORDER — ORAL CARE MOUTH RINSE: 15.0000 mL | Freq: Once | OROMUCOSAL | Status: AC

## 2020-01-06 MED ORDER — LIDOCAINE HCL (PF) 2 % IJ SOLN
INTRAMUSCULAR | Status: AC
  Filled 2020-01-06: qty 5

## 2020-01-06 MED ORDER — FENTANYL CITRATE (PF) 100 MCG/2ML IJ SOLN
INTRAMUSCULAR | Status: AC
  Filled 2020-01-06: qty 2

## 2020-01-06 MED ORDER — CHLORHEXIDINE GLUCONATE 0.12 % MT SOLN: 15.0000 mL | Freq: Once | OROMUCOSAL | Status: AC

## 2020-01-06 MED ORDER — FAMOTIDINE 20 MG PO TABS: 20.0000 mg | ORAL_TABLET | Freq: Once | ORAL | Status: AC

## 2020-01-06 MED ORDER — LACTATED RINGERS IV SOLN: INTRAVENOUS | Status: DC

## 2020-01-06 MED ORDER — MIDAZOLAM HCL 2 MG/2ML IJ SOLN
INTRAMUSCULAR | Status: AC
  Filled 2020-01-06: qty 2

## 2020-01-06 MED ORDER — PROPOFOL 10 MG/ML IV BOLUS
INTRAVENOUS | Status: AC
  Filled 2020-01-06: qty 20

## 2020-01-06 MED ORDER — CEFAZOLIN SODIUM-DEXTROSE 2-4 GM/100ML-% IV SOLN
INTRAVENOUS | Status: AC
  Filled 2020-01-06: qty 100

## 2020-01-06 MED ORDER — POVIDONE-IODINE 10 % EX SWAB: 2.0000 "application " | Freq: Once | CUTANEOUS | Status: DC

## 2020-01-06 MED ORDER — ONDANSETRON HCL 4 MG/2ML IJ SOLN
INTRAMUSCULAR | Status: AC
  Filled 2020-01-06: qty 2

## 2020-01-06 MED ORDER — DEXAMETHASONE SODIUM PHOSPHATE 10 MG/ML IJ SOLN
INTRAMUSCULAR | Status: AC
  Filled 2020-01-06: qty 1

## 2020-01-06 MED ORDER — KETOROLAC TROMETHAMINE 15 MG/ML IJ SOLN: 15.0000 mg | INTRAMUSCULAR | Status: AC

## 2020-01-06 MED ORDER — FENTANYL CITRATE (PF) 100 MCG/2ML IJ SOLN
25.0000 ug | INTRAMUSCULAR | Status: DC | PRN
  Administered 2020-01-06: 25 ug via INTRAVENOUS

## 2020-01-06 MED ORDER — ACETAMINOPHEN 500 MG PO TABS
ORAL_TABLET | ORAL | Status: AC
  Administered 2020-01-06: 1000 mg via ORAL
  Filled 2020-01-06: qty 2

## 2020-01-06 MED ORDER — CHLORHEXIDINE GLUCONATE 0.12 % MT SOLN
OROMUCOSAL | Status: AC
  Administered 2020-01-06: 15 mL via OROMUCOSAL
  Filled 2020-01-06: qty 15

## 2020-01-06 MED ORDER — GABAPENTIN 300 MG PO CAPS
ORAL_CAPSULE | ORAL | Status: AC
  Administered 2020-01-06: 600 mg via ORAL
  Filled 2020-01-06: qty 2

## 2020-01-06 SURGICAL SUPPLY — 15 items
ABLATOR SURESOUND NOVASURE (ABLATOR) ×3 IMPLANT
CATH ROBINSON RED A/P 16FR (CATHETERS) ×3 IMPLANT
COVER WAND RF STERILE (DRAPES) ×3 IMPLANT
DRSG TEGADERM 6X8 (GAUZE/BANDAGES/DRESSINGS) ×3 IMPLANT
GLOVE PI ORTHOPRO 6.5 (GLOVE) ×4
GLOVE PI ORTHOPRO STRL 6.5 (GLOVE) ×2 IMPLANT
GLOVE SURG SYN 6.5 ES PF (GLOVE) ×6 IMPLANT
GOWN STRL REUS W/ TWL LRG LVL3 (GOWN DISPOSABLE) ×2 IMPLANT
GOWN STRL REUS W/TWL LRG LVL3 (GOWN DISPOSABLE) ×6
IV LACTATED RINGERS 1000ML (IV SOLUTION) ×3 IMPLANT
NS IRRIG 500ML POUR BTL (IV SOLUTION) ×3 IMPLANT
PACK DNC HYST (MISCELLANEOUS) ×3 IMPLANT
PAD OB MATERNITY 4.3X12.25 (PERSONAL CARE ITEMS) ×3 IMPLANT
TUBING CONNECTING 10 (TUBING) ×2 IMPLANT
TUBING CONNECTING 10' (TUBING) ×1

## 2020-01-06 NOTE — Interval H&P Note (Signed)
History and Physical Interval Note:  01/06/2020 12:33 PM  Susan Higgins  has presented today for surgery, with the diagnosis of AUB.  The various methods of treatment have been discussed with the patient and family. After consideration of risks, benefits and other options for treatment, the patient has consented to  Procedure(s): DILATATION & CURETTAGE/HYSTEROSCOPY WITH NOVASURE ABLATION (N/A) as a surgical intervention.  The patient's history has been reviewed, patient examined, no change in status, stable for surgery.  I have reviewed the patient's chart and labs.  Questions were answered to the patient's satisfaction.     Sivan Cuello C Sira Adsit

## 2020-01-06 NOTE — Anesthesia Preprocedure Evaluation (Signed)
Anesthesia Evaluation  Patient identified by MRN, date of birth, ID band Patient awake    Reviewed: Allergy & Precautions, H&P , NPO status , Patient's Chart, lab work & pertinent test results, reviewed documented beta blocker date and time   History of Anesthesia Complications (+) PONV and history of anesthetic complications  Airway Mallampati: III  TM Distance: >3 FB Neck ROM: full    Dental  (+) Dental Advidsory Given, Teeth Intact   Pulmonary neg pulmonary ROS,    Pulmonary exam normal        Cardiovascular Exercise Tolerance: Good (-) angina(-) CAD, (-) Past MI, (-) Cardiac Stents and (-) CABG negative cardio ROS Normal cardiovascular exam(-) dysrhythmias (-) Valvular Problems/Murmurs     Neuro/Psych negative neurological ROS  negative psych ROS   GI/Hepatic Neg liver ROS, GERD (during pregnancy)  ,  Endo/Other  neg diabetesMorbid obesity  Renal/GU negative Renal ROS  negative genitourinary   Musculoskeletal   Abdominal   Peds  Hematology negative hematology ROS (+)   Anesthesia Other Findings Past Medical History: No date: Gestational diabetes No date: Hypertension No date: PONV (postoperative nausea and vomiting)   Reproductive/Obstetrics negative OB ROS                             Anesthesia Physical  Anesthesia Plan  ASA: III  Anesthesia Plan: General   Post-op Pain Management:    Induction: Intravenous  PONV Risk Score and Plan: 4 or greater and Propofol infusion and TIVA  Airway Management Planned: Natural Airway and Simple Face Mask  Additional Equipment:   Intra-op Plan:   Post-operative Plan:   Informed Consent: I have reviewed the patients History and Physical, chart, labs and discussed the procedure including the risks, benefits and alternatives for the proposed anesthesia with the patient or authorized representative who has indicated his/her  understanding and acceptance.     Dental Advisory Given  Plan Discussed with: Anesthesiologist, CRNA and Surgeon  Anesthesia Plan Comments:         Anesthesia Quick Evaluation

## 2020-01-06 NOTE — Transfer of Care (Signed)
Immediate Anesthesia Transfer of Care Note  Patient: Susan Higgins  Procedure(s) Performed: DILATATION & CURETTAGE/HYSTEROSCOPY WITH NOVASURE ABLATION (N/A )  Patient Location: PACU  Anesthesia Type:General  Level of Consciousness: awake, alert  and oriented  Airway & Oxygen Therapy: Patient Spontanous Breathing and Patient connected to face mask oxygen  Post-op Assessment: Report given to RN and Post -op Vital signs reviewed and stable  Post vital signs: Reviewed and stable  Last Vitals:  Vitals Value Taken Time  BP 131/96 01/06/20 1501  Temp 36.2 C 01/06/20 1500  Pulse 66 01/06/20 1505  Resp 15 01/06/20 1505  SpO2 100 % 01/06/20 1505  Vitals shown include unvalidated device data.  Last Pain:  Vitals:   01/06/20 1500  TempSrc:   PainSc: 0-No pain         Complications: No complications documented.

## 2020-01-06 NOTE — Discharge Instructions (Signed)
AMBULATORY SURGERY  DISCHARGE INSTRUCTIONS   1) The drugs that you were given will stay in your system until tomorrow so for the next 24 hours you should not:  A) Drive an automobile B) Make any legal decisions C) Drink any alcoholic beverage   2) You may resume regular meals tomorrow.  Today it is better to start with liquids and gradually work up to solid foods.  You may eat anything you prefer, but it is better to start with liquids, then soup and crackers, and gradually work up to solid foods.   3) Please notify your doctor immediately if you have any unusual bleeding, trouble breathing, redness and pain at the surgery site, drainage, fever, or pain not relieved by medication.    4) Additional Instructions:        Please contact your physician with any problems or Same Day Surgery at [phone], Monday through Friday 6 am to 4 pm, or  at [facility] number at [phone].You should expect to have some cramping and light vaginal bleeding for about a week. This should taper off and subside, much like a period. If heavy bleeding continues or gets worse, you should contact the office for an earlier appointment.   Please call the office or physician on call for fever >101, severe pain, and heavy bleeding.   [phone]  NOTHING IN THE VAGINA FOR 2 WEEKS!!  Dr. Elesa Massed will discuss pathology results with you at your postop visit.

## 2020-01-06 NOTE — Op Note (Signed)
Operative Report Hysteroscopy, Dilation and Curettage 01/06/2020  Patient:  Susan Higgins  34 y.o. female Preoperative diagnosis:  AUB Postoperative diagnosis:  AUB  PROCEDURE:  Procedure(s): DILATATION & CURETTAGE/HYSTEROSCOPY WITH NOVASURE ABLATION (N/A) Surgeon:  Surgeon(s) and Role:    * Emarion Toral, Honor Loh, MD - Primary Anesthesia:  MAC I/O: Total I/O In: 622 [I.V.:850] Out: 5 [Blood:5] Specimens:  Endometrial curettings Complications: None Apparent Disposition:  VS stable to PACU  Findings: Uterus, mobile, top-normal size, sounding to 10 cm; normal cervix, vagina, perineum.  Hysteroscopic findings:  Thickened, fluffy endometrium.  Both ostea patent  Indication for procedure/Consents: 34 y.o. [id number]  here for scheduled surgery for the aforementioned diagnoses. Options for treatment were discussed in detail preoperatively.  Risks of surgery were discussed with the patient including but not limited to: bleeding which may require transfusion; infection which may require antibiotics; injury to uterus or surrounding organs; intrauterine scarring which may impair future fertility; need for additional procedures including laparotomy or laparoscopy; and other postoperative/anesthesia complications. Written informed consent was obtained.    Procedure Details:   The patient was then taken to the operating room where anesthesia was administered and was found to be adequate.  After a formal timeout was performed, she was placed in the dorsal lithotomy position and examined with the above findings. She was then prepped and draped in the sterile manner.  A speculum was then placed in the patient's vagina and a single tooth tenaculum was applied to the anterior lip of the cervix.    The uterus was sounded to 10cm. Her cervix was serially dilated to accommodate the myoscope, with findings as above. A sharp curettage was then performed until there was a gritty texture in all four quadrants. The  specimen was handed off to nursing.  The camera was reinserted and confirmed the uterus had been evacuated. The Novasure device was placed into the cavity after the cavity and cervical lengths were obtained and confirmed.  The cautery was performed for 60 seconds until it discontinued, indicating the appropriate impedence had been achieved.  The camera was reinserted and confirmed that the cavity had been sufficiently ablated. The tenaculum was removed from the anterior lip of the cervix, silver nitrate applied to the bleeding tenaculum site, and the vaginal speculum was removed after noting good hemostasis. The patient tolerated the procedure well and was taken to the recovery area awake, extubated and in stable condition.  The patient will be discharged to home as per PACU criteria.  Routine postoperative instructions given. She will follow up in the clinic in two to four weeks for postoperative evaluation.  Larey Days, MD [facility]YN Attending Gynecologist

## 2020-01-08 ENCOUNTER — Encounter: Payer: Self-pay | Admitting: Obstetrics & Gynecology

## 2020-01-09 NOTE — Anesthesia Postprocedure Evaluation (Signed)
Anesthesia Post Note  Patient: Susan Higgins  Procedure(s) Performed: DILATATION & CURETTAGE/HYSTEROSCOPY WITH NOVASURE ABLATION (N/A )  Patient location during evaluation: PACU Anesthesia Type: General Level of consciousness: awake and alert Pain management: pain level controlled Vital Signs Assessment: post-procedure vital signs reviewed and stable Respiratory status: spontaneous breathing, nonlabored ventilation, respiratory function stable and patient connected to nasal cannula oxygen Cardiovascular status: blood pressure returned to baseline and stable Postop Assessment: no apparent nausea or vomiting Anesthetic complications: no   No complications documented.   Last Vitals:  Vitals:   01/06/20 1542 01/06/20 1549  BP:  (!) 142/92  Pulse: (!) 59 74  Resp: 18 18  Temp: (!) 36 C (!) 36.3 C  SpO2: 96% 98%    Last Pain:  Vitals:   01/09/20 0851  TempSrc:   PainSc: 0-No pain                 Lenard Simmer

## 2020-01-10 LAB — SURGICAL PATHOLOGY
